# Patient Record
Sex: Male | Born: 1935 | Race: White | Hispanic: No | Smoking: Former smoker
Health system: Southern US, Community
[De-identification: ages and names within clinical notes are randomized; demographics above are authoritative.]

## PROBLEM LIST (undated history)

## (undated) DIAGNOSIS — I10 Essential (primary) hypertension: Secondary | ICD-10-CM

## (undated) DIAGNOSIS — IMO0001 Reserved for inherently not codable concepts without codable children: Secondary | ICD-10-CM

## (undated) DIAGNOSIS — J449 Chronic obstructive pulmonary disease, unspecified: Secondary | ICD-10-CM

## (undated) DIAGNOSIS — E119 Type 2 diabetes mellitus without complications: Secondary | ICD-10-CM

## (undated) DIAGNOSIS — H9190 Unspecified hearing loss, unspecified ear: Secondary | ICD-10-CM

## (undated) DIAGNOSIS — H01009 Unspecified blepharitis unspecified eye, unspecified eyelid: Secondary | ICD-10-CM

## (undated) DIAGNOSIS — E079 Disorder of thyroid, unspecified: Secondary | ICD-10-CM

## (undated) DIAGNOSIS — M109 Gout, unspecified: Secondary | ICD-10-CM

## (undated) DIAGNOSIS — E785 Hyperlipidemia, unspecified: Secondary | ICD-10-CM

## (undated) HISTORY — DX: Type 2 diabetes mellitus without complications: E11.9

## (undated) HISTORY — DX: Hyperlipidemia, unspecified: E78.5

## (undated) HISTORY — PX: ABDOMINAL AORTIC ANEURYSM REPAIR: SUR1152

## (undated) HISTORY — DX: Gout, unspecified: M10.9

## (undated) HISTORY — DX: Reserved for inherently not codable concepts without codable children: IMO0001

## (undated) HISTORY — DX: Unspecified hearing loss, unspecified ear: H91.90

## (undated) HISTORY — DX: Disorder of thyroid, unspecified: E07.9

## (undated) HISTORY — PX: CATARACT EXTRACTION: SUR2

## (undated) HISTORY — DX: Chronic obstructive pulmonary disease, unspecified: J44.9

## (undated) HISTORY — PX: JOINT REPLACEMENT: SHX530

## (undated) HISTORY — DX: Unspecified blepharitis unspecified eye, unspecified eyelid: H01.009

## (undated) HISTORY — DX: Essential (primary) hypertension: I10

---

## 1999-11-08 ENCOUNTER — Encounter: Payer: Self-pay | Admitting: Ophthalmology

## 1999-11-08 ENCOUNTER — Ambulatory Visit: Admission: RE | Admit: 1999-11-08 | Discharge: 1999-11-08 | Payer: Self-pay | Admitting: Ophthalmology

## 1999-11-17 ENCOUNTER — Ambulatory Visit (HOSPITAL_COMMUNITY): Admission: RE | Admit: 1999-11-17 | Discharge: 1999-11-18 | Payer: Self-pay | Admitting: Ophthalmology

## 2000-09-14 ENCOUNTER — Ambulatory Visit (HOSPITAL_COMMUNITY): Admission: AD | Admit: 2000-09-14 | Discharge: 2000-09-15 | Payer: Self-pay | Admitting: Ophthalmology

## 2000-09-14 ENCOUNTER — Encounter: Payer: Self-pay | Admitting: Ophthalmology

## 2002-05-26 ENCOUNTER — Encounter: Admission: RE | Admit: 2002-05-26 | Discharge: 2002-05-26 | Payer: Self-pay | Admitting: Vascular Surgery

## 2002-05-26 ENCOUNTER — Encounter: Payer: Self-pay | Admitting: Vascular Surgery

## 2002-05-29 ENCOUNTER — Encounter: Payer: Self-pay | Admitting: Vascular Surgery

## 2002-06-02 ENCOUNTER — Ambulatory Visit (HOSPITAL_COMMUNITY): Admission: RE | Admit: 2002-06-02 | Discharge: 2002-06-02 | Payer: Self-pay | Admitting: Vascular Surgery

## 2002-06-22 ENCOUNTER — Encounter: Payer: Self-pay | Admitting: Vascular Surgery

## 2002-06-22 ENCOUNTER — Inpatient Hospital Stay (HOSPITAL_COMMUNITY): Admission: RE | Admit: 2002-06-22 | Discharge: 2002-06-28 | Payer: Self-pay | Admitting: Vascular Surgery

## 2002-06-22 ENCOUNTER — Encounter (INDEPENDENT_AMBULATORY_CARE_PROVIDER_SITE_OTHER): Payer: Self-pay | Admitting: *Deleted

## 2002-06-23 ENCOUNTER — Encounter: Payer: Self-pay | Admitting: Vascular Surgery

## 2007-08-15 ENCOUNTER — Ambulatory Visit: Payer: Self-pay | Admitting: Cardiovascular Disease

## 2007-08-20 ENCOUNTER — Ambulatory Visit: Payer: Self-pay

## 2007-12-12 ENCOUNTER — Inpatient Hospital Stay (HOSPITAL_COMMUNITY): Admission: RE | Admit: 2007-12-12 | Discharge: 2007-12-16 | Payer: Self-pay | Admitting: Orthopedic Surgery

## 2008-04-15 DIAGNOSIS — E119 Type 2 diabetes mellitus without complications: Secondary | ICD-10-CM

## 2008-04-15 DIAGNOSIS — R079 Chest pain, unspecified: Secondary | ICD-10-CM

## 2008-04-15 DIAGNOSIS — Z9889 Other specified postprocedural states: Secondary | ICD-10-CM

## 2008-04-15 DIAGNOSIS — J449 Chronic obstructive pulmonary disease, unspecified: Secondary | ICD-10-CM

## 2008-04-15 DIAGNOSIS — I10 Essential (primary) hypertension: Secondary | ICD-10-CM

## 2008-04-15 DIAGNOSIS — M1990 Unspecified osteoarthritis, unspecified site: Secondary | ICD-10-CM | POA: Insufficient documentation

## 2008-04-16 ENCOUNTER — Ambulatory Visit: Payer: Self-pay | Admitting: Cardiovascular Disease

## 2008-05-10 ENCOUNTER — Inpatient Hospital Stay (HOSPITAL_COMMUNITY): Admission: RE | Admit: 2008-05-10 | Discharge: 2008-05-14 | Payer: Self-pay | Admitting: Orthopedic Surgery

## 2008-09-23 ENCOUNTER — Encounter (INDEPENDENT_AMBULATORY_CARE_PROVIDER_SITE_OTHER): Payer: Self-pay | Admitting: *Deleted

## 2009-12-02 ENCOUNTER — Encounter: Admission: RE | Admit: 2009-12-02 | Discharge: 2009-12-02 | Payer: Self-pay | Admitting: Nephrology

## 2010-05-10 LAB — BASIC METABOLIC PANEL
CO2: 27 mEq/L (ref 19–32)
CO2: 28 mEq/L (ref 19–32)
Calcium: 8.3 mg/dL — ABNORMAL LOW (ref 8.4–10.5)
Calcium: 8.3 mg/dL — ABNORMAL LOW (ref 8.4–10.5)
Calcium: 8.4 mg/dL (ref 8.4–10.5)
Calcium: 8.4 mg/dL (ref 8.4–10.5)
Chloride: 101 mEq/L (ref 96–112)
Chloride: 98 mEq/L (ref 96–112)
Creatinine, Ser: 0.95 mg/dL (ref 0.4–1.5)
Creatinine, Ser: 1.1 mg/dL (ref 0.4–1.5)
GFR calc Af Amer: >60 mL/min (ref >60)
GFR calc Af Amer: >60 mL/min (ref >60)
GFR calc Af Amer: >60 mL/min (ref >60)
GFR calc non Af Amer: >60 mL/min (ref >60)
GFR calc non Af Amer: >60 mL/min (ref >60)
Glucose, Bld: 119 mg/dL — ABNORMAL HIGH (ref 70–99)
Glucose, Bld: 131 mg/dL — ABNORMAL HIGH (ref 70–99)
Glucose, Bld: 151 mg/dL — ABNORMAL HIGH (ref 70–99)
Potassium: 3.8 mEq/L (ref 3.5–5.1)
Potassium: 3.9 mEq/L (ref 3.5–5.1)
Potassium: 4 mEq/L (ref 3.5–5.1)
Sodium: 133 mEq/L — ABNORMAL LOW (ref 135–145)
Sodium: 134 mEq/L — ABNORMAL LOW (ref 135–145)

## 2010-05-10 LAB — CBC
HCT: 29.4 % — ABNORMAL LOW (ref 39.0–52.0)
HCT: 29.4 % — ABNORMAL LOW (ref 39.0–52.0)
Hemoglobin: 10.2 g/dL — ABNORMAL LOW (ref 13.0–17.0)
Hemoglobin: 8.8 g/dL — ABNORMAL LOW (ref 13.0–17.0)
MCHC: 34.5 g/dL (ref 30.0–36.0)
MCHC: 34.8 g/dL (ref 30.0–36.0)
MCV: 92.9 fL (ref 78.0–100.0)
MCV: 93.9 fL (ref 78.0–100.0)
Platelets: 173 10*3/uL (ref 150–400)
RBC: 2.72 MIL/uL — ABNORMAL LOW (ref 4.22–5.81)
RBC: 3.19 MIL/uL — ABNORMAL LOW (ref 4.22–5.81)
RDW: 13 % (ref 11.5–15.5)
RDW: 13.1 % (ref 11.5–15.5)
WBC: 8.5 10*3/uL (ref 4.0–10.5)

## 2010-05-10 LAB — GLUCOSE, CAPILLARY
Glucose-Capillary: 108 mg/dL — ABNORMAL HIGH (ref 70–99)
Glucose-Capillary: 100 mg/dL — ABNORMAL HIGH (ref 70–99)
Glucose-Capillary: 103 mg/dL — ABNORMAL HIGH (ref 70–99)
Glucose-Capillary: 107 mg/dL — ABNORMAL HIGH (ref 70–99)
Glucose-Capillary: 108 mg/dL — ABNORMAL HIGH (ref 70–99)
Glucose-Capillary: 116 mg/dL — ABNORMAL HIGH (ref 70–99)
Glucose-Capillary: 124 mg/dL — ABNORMAL HIGH (ref 70–99)
Glucose-Capillary: 132 mg/dL — ABNORMAL HIGH (ref 70–99)
Glucose-Capillary: 147 mg/dL — ABNORMAL HIGH (ref 70–99)

## 2010-05-10 LAB — URINALYSIS, ROUTINE W REFLEX MICROSCOPIC
Hgb urine dipstick: NEGATIVE
Ketones, ur: NEGATIVE mg/dL
Nitrite: NEGATIVE
pH: 6 (ref 5.0–8.0)

## 2010-05-10 LAB — PROTIME-INR
INR: 1 (ref 0.00–1.49)
INR: 1.2 (ref 0.00–1.49)
Prothrombin Time: 22.7 seconds — ABNORMAL HIGH (ref 11.6–15.2)
Prothrombin Time: 20.6 seconds — ABNORMAL HIGH (ref 11.6–15.2)

## 2010-05-10 LAB — COMPREHENSIVE METABOLIC PANEL
AST: 14 U/L (ref 0–37)
Alkaline Phosphatase: 68 U/L (ref 39–117)
Chloride: 107 mEq/L (ref 96–112)
GFR calc non Af Amer: >60 mL/min (ref >60)
Glucose, Bld: 125 mg/dL — ABNORMAL HIGH (ref 70–99)
Total Protein: 6.5 g/dL (ref 6.0–8.3)

## 2010-05-10 LAB — TYPE AND SCREEN
ABO/RH(D): O POS
Antibody Screen: NEGATIVE

## 2010-06-13 NOTE — H&P (Signed)
NAMEEREN, RYSER NO.:  [id number]   MEDICAL RECORD NO.:  [id number]          PATIENT TYPE:  INP   LOCATION:  NA                           FACILITY:  [facility]   PHYSICIAN:  Ollen Gross, M.D.    DATE OF BIRTH:  Dec 03, 1924   DATE OF ADMISSION:  12/12/2007  DATE OF DISCHARGE:                              HISTORY & PHYSICAL   CHIEF COMPLAINT:  Bilateral knee pain, left greater than right.   HISTORY OF PRESENT ILLNESS:  Mr. Daywalt is an 77 year old male who has  been seen by Dr. Lequita Halt as a new patient for bilateral knee pain.  Left  has been more symptomatic than the right for quite some time now.  He  has had pain in the knees for many years.  He remains very active  though.  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  He has been seen by Dr. Trellis Paganini and  been given multiple injections in the past which have helped.  Currently  he has severe end stage arthritis of both knees; the left is a little  worse than the right.  He has severe bone-on-bone medial compartment  with patellofemoral compartments also involved.  He is at a point now  where injections are helping less and less.  He is having more symptoms  and wants to proceed with surgery.  He has been seen by Lindaann Pascal from  a medical standpoint and he has been referred over to Cardiology.  He  has been seen by Dr. Charlton Haws from a cardiac standpoint.  He has  undergone stress test and evaluation and felt to be stable for surgery.   ALLERGIES:  PENICILLIN causes welts.   CURRENT MEDICATIONS:  1. Furosemide 40 mg 3 times a day.  2. Simvastatin 40 mg daily.  3. Levothyroxine 50 mcg daily.  4. Tramadol 50 mg 1 every 4-6 hours as needed.  5. Amlodipine/benazepril 5/20 one tablet daily.  6. Spiriva inhaler once every morning.  7. ProAir inhaler as needed.   PAST MEDICAL HISTORY:  1. Blindness right eye.  2. Deafness right ear.  3. Hearing loss left  ear which he uses a hearing aid.  4. Questionable history of COPD with prior tobacco use.  5. Hypertension.  6. Has an abdominal aortic aneurysm.  7. Elevated cholesterol.  8. History of gout.  9. History of pilonidal cyst removal.  10.Diabetes mellitus.  11.Arthritis.   PAST SURGICAL HISTORY:  Abdominal aortic aneurysm repair in 2003.   FAMILY HISTORY:  Father with a history of heart attack.  Mother history  of stroke.   SOCIAL HISTORY:  Widow.  Retired.  Past smoker, quit back in 1983.  Two  children.  Lives alone.  One of his daughters will be helping him after  surgery.  He does have a ramp entering his one-story home.  He does have  healthcare power of attorney.   REVIEW OF SYSTEMS:  GENERAL:  No fevers, chills, night sweats.  NEURO:  No  seizures, syncope or paralysis.  He does have blindness in the right  eye and deafness in the right ear with hearing loss in the left ear  requiring hearing aid.  RESPIRATORY:  A little bit of shortness breath  noted on exertion.  No shortness of breath at rest.  No productive cough  or hemoptysis.  CARDIOVASCULAR:  No chest pain, angina or orthopnea.  GI: No nausea, vomiting, diarrhea or constipation.  GU: No dysuria,  hematuria or discharge.  MUSCULOSKELETAL:  Left knee greater than right  knee.   PHYSICAL EXAMINATION:  VITAL SIGNS: Pulse 76, respirations 14, blood  pressure 138/64.  GENERAL: The patient is an 77 year old white male well-nourished, well-  developed in no acute distress.  Hard of hearing.  He is accompanied by  his 2 daughters.  He is deaf in the right ear.  Also has a hearing aid  in the left ear.  HEENT: Normocephalic, atraumatic.  He is blind in the right eye.  Left  eye is regular rhythm.  EOMs intact on the left eye.  NECK:  Supple.  CHEST: Clear.  HEART: Regular rate and rhythm without murmur, S1 and S2 noted.  ABDOMEN: Soft, slightly round.  Bowel sounds present.  BREASTS/ GENITALIA:  Not done; not pertinent to  present illness.  EXTREMITIES:  Right knee shows no effusion.  Range of motion 5-120,  marked crepitus, no instability.  Left knee shows no effusion, moderate  varus malalignment.  Range of motion 5-115, tender more medial than  lateral.   IMPRESSION:  Osteoarthritis bilateral knees, left greater than right.   PLAN:  The patient admitted to [facility] to undergo a left  total knee replacement arthroplasty.  Surgery will be performed by Dr.  Ollen Gross.  He has been seen preoperatively by Dr. [name] and felt  that the patient would benefit from procedures.  He had a cardiac  Myoview which was read as a normal stress nuclear test and felt to be  low risk.      Alexzandrew L. Perkins, P.A.C.      Ollen Gross, M.D.  Electronically Signed    ALP/MEDQ  D:  12/02/2007  T:  12/02/2007  Job:  [id number]   cc:   Lindaann Pascal, PA-C   [name] [name], MD, [facility]  [address]  [address]  [place]  [place] [address]

## 2010-06-13 NOTE — Discharge Summary (Signed)
NAMEINMAN, FETTIG NO.:  [id number]   MEDICAL RECORD NO.:  [id number]          PATIENT TYPE:  INP   LOCATION:  1527                         FACILITY:  [facility]   PHYSICIAN:  Ollen Gross, M.D.    DATE OF BIRTH:  August 22, 1924   DATE OF ADMISSION:  12/12/2007  DATE OF DISCHARGE:  12/16/2007                               DISCHARGE SUMMARY   ADMITTING DIAGNOSES:  1. Osteoarthritis, left greater than right knee.  2. Blindness, right eye.  3. Deafness, right ear.  4. Hearing loss, left ear which she uses a hearing aid.  5. Questionable history of chronic obstructive pulmonary disease with      prior tobacco use.  6. Hypertension.  7. Abdominal aortic aneurysm.  8. Hypercholesterolemia.  9. History of gout.  10.History of pilonidal cyst removal.  11.Diabetes mellitus.  12.Arthritis.   DISCHARGE DIAGNOSES:  1. Osteoarthritis left knee status post left total knee replacement      arthroplasty.  2. Postoperative blood loss anemia, did not require transfusion.  3. Postoperative hyponatremia improved.  4. Postoperative atypical chest discomfort, ruled out cardiac.  5. Mild postoperative bibasilar atelectasis.   Remaining discharge diagnoses same as admitting diagnoses.   PROCEDURE:  December 12, 2007 left total knee, Surgeon Dr. Lequita Halt,  Assistant Avel Peace, PA-C.  Surgery performed under spinal anesthesia  with Duramorph.   CONSULTS:  None.   BRIEF HISTORY:  The patient is an 77 year old male with severe end-stage  arthritis of both knees, left greater than right, failing operative  management including injection, now presents for total knee  arthroplasty.   LABORATORY DATA:  Preop CBC showed a hemoglobin 15.4, hematocrit of  44.8, white cell count 6.3, platelets 225.  Chem panel:  Slightly  elevated glucose of 114, slightly elevated albumin 4.8, PT/INR 12.8 and  1 with a PTT of 29.  Preop UA negative.  Serial CBCs were followed,  hemoglobin did drop  down to 11 then 9.4, last known H and H was 8.9 and  25.6.  Serial ProTimes followed per Coumadin protocol, last known PT/INR  15.5 and 1.2.  Serial BMETs were followed, sodium did drop down to 133  then got as low as 129, was back up to 131, remaining electrolytes  remained within normal limits.  Two sets of cardiac enzymes taken:  First set:  CK 118, normal, normal CK-MB of 2.8, normal index of 2.4  with a normal troponin of 0.01; second set taken on December 15, 2007:  CK normal at 109, CK-MB normal at 2.6, relative index normal at 2.4,  normal troponin at 0.01; third set not done.   Chest x-ray:  Preop chest x-ray dated August 08, 2007 cardiomegaly with  dense, calcified, tortuous aorta, stable in configuration since previous  study, severe COPD with bibasilar scarring.  Follow-up chest x-ray:  Portable chest taken December 15, 2007, COPD with mild bibasilar  atelectasis.   EKG dated August 20, 2007:  Normal sinus rhythm, cannot rule out anterior  infarct, age undetermined, unconfirmed, was a faxed over copy.  Follow-  up  EKG December 15, 2007:  Sinus rhythm with occasional PVCs, otherwise  normal, unconfirmed.   HOSPITAL COURSE:  Patient admitted to [facility], taken to the  OR and underwent above-stated procedure without complication.  The  patient tolerated the procedure well.  Later transferred to recovery  room on orthopedic floor, started on PCA and p.o. analgesic pain control  following surgery, given 24 hours postoperative IV antibiotics.  Surgery  was performed on a Friday, therapy was started on Saturday, started  getting up out of bed by Sunday.  Sodium had dropped a little bit due to  fluid dilutional component as IVs were slowed down, encouraged pulmonary  toilet, started getting up and walking a few feet.  Dressing change,  incision looked good with the exception of some blisters on the lower  incision.  He was walking about 50 and 80 feet over the weekend.   Tegaderm  over the incision did have a little bit of bruising.  He was  seen on postoperative day 3 and earlier that morning he had had a little  bit of intermittent chest discomfort, denied any nausea and vomiting, no  radiation of the pain to the back, arm or neck, it was not constant.  By  the time he was seen on rounds the chest discomfort had resolved but he  had had it a couple of times.  Cardiac enzymes were taken at that point  which were negative.  We did another follow-up set which were negative.  EKG only showed some PVCs and his chest x-ray was clear otherwise, just  showing some atelectasis.  We encouraged the incentive spirometer.  Did  not require any nitro did not have any further chest discomfort after  morning rounds.  He was noted to have a little bit of abdominal  distention.  He was passing flatus but no bowel movements so we gave him  a Dulcolax suppository also MiraLAX, he got good relief with 2 movements  later that day and felt much better.  We rechecked his sodium and his  sodium was up at 131, also placed him on some iron supplement.  Therapy  was held that morning due to the atypical chest discomfort, by that  afternoon he resumed therapy and walked about 95 feet.  He was doing  well, seen on rounds on postoperative day 4 by Dr. Lequita Halt, no further  chest discomfort, tolerating medications.   DISCHARGE PLAN:  1. Patient discharged home on December 16, 2007.  2. Discharge diagnosis, please see above.  3. Discharge medications:  Lovenox Coumadin, Percocet, Robaxin.   FOLLOWUP:  Two weeks.   ACTIVITY:  Weightbearing as tolerated, left lower extremity total knee  protocol.  Home health PT/home health nursing.   DIET:  Heart-healthy cardiac diet.   DISPOSITION:  Home.   CONDITION ON DISCHARGE:  Improved.      Alexzandrew L. Perkins, P.A.C.      Ollen Gross, M.D.  Electronically Signed    ALP/MEDQ  D:  12/16/2007  T:  12/16/2007  Job:   [id number]   cc:   Ollen Gross, M.D.  Fax: [phone]   [name]. [name], MD, [facility]  [address]  [address]  [place]  [place] [address]   Lindaann Pascal, [place]

## 2010-06-13 NOTE — H&P (Signed)
[name], SPRAGG NO.:  [id number]   MEDICAL RECORD NO.:  [id number]          PATIENT TYPE:  INP   LOCATION:  1609                         FACILITY:  [facility]   PHYSICIAN:  Ollen Gross, M.D.    DATE OF BIRTH:  16-Feb-1924   DATE OF ADMISSION:  05/10/2008  DATE OF DISCHARGE:                              HISTORY & PHYSICAL   DATE OF OFFICE VISIT HISTORY AND PHYSICAL:  05/04/2008.   CHIEF COMPLAINT:  Right knee pain.   HISTORY OF PRESENT ILLNESS:  Patient is an 77 year old male who has seen  by Dr. Lequita Halt for ongoing right knee pain.  He is well known for having  previously undergone a left total knee in November 2009, but now  presents to have the right knee done which has continued to be  problematic and symptomatic.  Patient is subsequently admitted to the  hospital.   ALLERGIES:  PENICILLIN causes welts.  TRAMADOL causes breakouts.   CURRENT MEDICATIONS:  Amlodipine/benazepril, levothyroxine, simvastatin,  furosemide, Spiriva.   PAST MEDICAL HISTORY:  1. Blindness, right eye.  2. Deafness, right ear.  3. COPD.  4. Cataracts.  5. Hypertension.  6. History of abdominal aortic aneurysm.  7. Hypercholesterolemia.  8. History of gout.  9. History of pilonidal cyst.  10.Hypothyroidism.  11.Non-insulin-dependent diabetes mellitus.  12.Arthritis.   PAST SURGICAL HISTORY:  1. Abdominal aortic aneurysm repair in 2003.  2. Left total knee replacement November 2009.   SOCIAL HISTORY:  Widow.  Retired past smoker.  No alcohol.  Two  children.  Lives alone, but does have a healthcare power of attorney.   FAMILY HISTORY:  Father with a history of heart attack.  Mother with  history of stroke.   REVIEW OF SYSTEMS:  GENERAL:  No fevers, chills, or night sweats.  NEURO:  No seizures, syncope, or paralysis.  RESPIRATORY:  A little  shortness of breath on exertion, no shortness of breath at rest,  productive cough or hemoptysis.  CARDIOVASCULAR:  No chest  pain, angina  or orthopnea.  GI: No nausea, vomiting, diarrhea, or constipation.  GU:  No dysuria, hematuria, or discharge.  MUSCULOSKELETAL:  Right knee.   PHYSICAL EXAMINATION:  VITAL SIGNS:  Pulse 76.  Respirations 14.  Blood  pressure 118/64.  GENERAL:  An 77 year old white male well-nourished, well-developed, no  acute distress.  He is alert, oriented, and cooperative, accompanied by  his daughter.  HEENT: Normocephalic, atraumatic.  Blind right eye, left eye regular  rhythm.  Deaf right ear, hearing aid in the left ear.  NECK:  Supple.  Distant breath sounds, but present.  HEART:  Regular rate and rhythm with occasional skipped/ectopic beat.  S1, S2.  ABDOMEN:  Soft, nontender, bowel sounds present.  RECTAL/BREASTS/GENITALIA:  Not done, and not pertinent to present  illness.  EXTREMITIES:  Right knee.  Range of motion 5-120, slight laxity,  marked  crepitus, no instability.   IMPRESSION:  Osteoarthritis, right knee.   PLAN:  Patient admitted to [facility] to undergo a right total  knee replacement arthroplasty.  Surgery  will be performed by Dr. Ollen Gross.      [name], P.A.C.      Ollen Gross, M.D.  Electronically Signed    ALP/MEDQ  D:  05/11/2008  T:  05/11/2008  Job:  [id number]   cc:   Lindaann Pascal, P.A.C.   [name]. [name], MD, [facility]  [address]  [address]  [place]  [place] [address]

## 2010-06-13 NOTE — Op Note (Signed)
[name], [name] NO.:  [id number]   MEDICAL RECORD NO.:  [id number]          PATIENT TYPE:  INP   LOCATION:  0002                         FACILITY:  [facility]   PHYSICIAN:  Ollen Gross, M.D.    DATE OF BIRTH:  Jun 23, 1924   DATE OF PROCEDURE:  12/12/2007  DATE OF DISCHARGE:                               OPERATIVE REPORT   PREOPERATIVE DIAGNOSIS:  Osteoarthritis, left knee.   POSTOPERATIVE DIAGNOSIS:  Osteoarthritis, left knee.   PROCEDURE:  Left total knee arthroplasty.   SURGEON:  Ollen Gross, M.D.   ASSISTANT:  Avel Peace PA-C   ANESTHESIA:  Spinal with Duramorph.   ESTIMATED BLOOD LOSS:  Minimal.   DRAINS:  None.   TOURNIQUET TIME:  34 minutes at 300 mmHg.   COMPLICATIONS:  None.   CONDITION:  Stable to recovery room.   CLINICAL NOTE:  [name] is an 77 year old male with severe end-stage  arthritis of both knees, left more symptomatic than right.  He has  failed nonoperative management including injection and presents now for  total knee arthroplasty.   PROCEDURE IN DETAIL:  After successful administration of spinal  anesthetic, a tourniquet is placed high on the left thigh and left lower  extremity prepped and draped in the usual sterile fashion.  Extremity  was wrapped in Esmarch, knee flexed, tourniquet inflated to 300 mmHg.  Midline incision made with a 10 blade through subcutaneous tissue to the  level of the extensor mechanism.  A fresh blade was used to make a  medial parapatellar arthrotomy.  Soft tissue of the proximal medial  tibia subperiosteally elevated to the joint line with the knife and into  the semimembranosus bursa with a Cobb elevator.  Soft tissue laterally  is elevated with attention being paid to avoiding patellar tendon on  tibial tubercle.  Patella subluxed laterally, knee flexed 90 degrees,  ACL and PCL removed.  Drill was used to create a starting hole in the  distal femur and the canal was thoroughly  irrigated.  The 5 degree left  valgus alignment guide is placed and referencing off the posterior  condyles, rotations marked and the block pinned to remove 11 mm of the  distal femur.  Distal femoral resection is made with an oscillating saw.  Sizing block was placed, size 5 is most appropriate.  Rotations marked  at the epicondylar axis.  Size 5 cutting block is placed and then the  anterior, posterior and chamfer cuts are made.   The tibia subluxed forward and the menisci are removed.  Extramedullary  tibial alignment guide is placed referencing proximally at the medial  aspect of the tibial tubercle and distally along the second metatarsal  axis and tibial crest.  The block is pinned to remove 10 mm off the non  deficient lateral side.  Tibial resection is made with an oscillating  saw.  Size 5 is most appropriate tibial component and the proximal tibia  was prepared with the modular drill and keel punch for size 5.  Femoral  preparation is completed with the intercondylar  cut.   Size 5 mobile bearing tibial trial, size 5 posterior stabilized femoral  trial and 10 mm posterior stabilized rotating platform insert trial are  placed.  With the 10, full extension was achieved with excellent varus-  valgus, anterior-posterior balance throughout full range of motion.  The  patella was everted and thickness measured to be 28 mm.  Freehand  resection taken 16 mm, 41 template is placed, lug holes were drilled,  trial patella was placed and it tracks normally.  Osteophytes removed  off the posterior femur with the trial in place.  All trials were  removed and the cut bone surfaces prepared with pulsatile lavage.  Cement was mixed and once ready for implantation, a size 5 mobile  bearing tibial tray, size 5 posterior stabilized femur and 41 patella  are cemented into place.  The patella was held with a clamp.  Trial 10-  mm inserts placed, knee held in full extension and all extruded cement   removed.  When the cement was fully hardened then the permanent 10 mm  posterior stabilized rotating platform insert is placed in the tibial  tray.  Wound is copiously irrigated with saline solution and then  FloSeal injected on the posterior capsule, mediolateral gutters and  suprapatellar area.  Moist sponge is placed and the tourniquet released  for total time of 34 minutes.  Sponge is held 2 minutes and removed.  Minimal bleeding was encountered.  The bleeding that is encountered is  stopped with electrocautery.  Wounds again copiously irrigated with  saline solution and the arthrotomy closed with interrupted #1 PDS.  Flexion against gravity to 140 degrees.  Subcu was closed with  interrupted 2-0 Vicryl and subcuticular running 4-0 Monocryl.  The  incision is cleaned and dried and Steri-Strips and bulky sterile  dressing applied.  He is then placed into a knee immobilizer, awakened  and transferred to recovery in stable condition.      Ollen Gross, M.D.  Electronically Signed     FA/MEDQ  D:  12/12/2007  T:  12/12/2007  Job:  [id number]

## 2010-06-13 NOTE — Op Note (Signed)
NAMECOURTLAND, REAS NO.:  [id number]   MEDICAL RECORD NO.:  [id number]          PATIENT TYPE:  INP   LOCATION:  1609                         FACILITY:  [facility]   PHYSICIAN:  Ollen Gross, M.D.    DATE OF BIRTH:  14-Feb-1924   DATE OF PROCEDURE:  05/10/2008  DATE OF DISCHARGE:                               OPERATIVE REPORT   PREOPERATIVE DIAGNOSIS:  Osteoarthritis right knee.   POSTOPERATIVE DIAGNOSIS:  Osteoarthritis right knee.   PROCEDURE:  Right total knee arthroplasty.   SURGEON:  Ollen Gross, M.D.   ASSISTANT:  Avel Peace PA-C   ANESTHESIA:  Spinal.   ESTIMATED BLOOD LOSS:  Minimal.   DRAINS:  None.   TOURNIQUET TIME:  36 minutes at 300 mmHg.   COMPLICATIONS:  None.   CONDITION:  Stable to recovery.   CLINICAL NOTE:  [name] is an 77 year old male with end-stage arthritis  of the right knee with progressively worsening pain and dysfunction.  He  had a recent successful left total knee arthroplasty and presents now  for right total knee arthroplasty.   PROCEDURE IN DETAIL:  After successful administration of spinal  anesthetic, a tourniquet was placed on his right thigh and right lower  extremity is prepped and draped in the usual sterile fashion.  Extremity  is wrapped with Esmarch, knee flexed, tourniquet inflated to 300 mmHg.  Midline incision was made with 10 blade through subcutaneous tissue to  the level of the extensor mechanism.  A fresh blade is used make a  medial parapatellar arthrotomy.  Soft tissue over the proximal and  medial tibia subperiosteally elevated to the joint line with the knife  into the semimembranosus bursa with a Cobb elevator.  Soft tissue  laterally is elevated with attention being paid to avoiding the patellar  tendon on tibial tubercle.  Patella subluxed laterally, knee flexed 90  degrees, ACL and PCL removed.  Drill was used create a starting hole in  the distal femur and canal was thoroughly irrigated.   A 5 degrees right  valgus alignment guide is placed and referencing off the posterior  condyles, rotations marked and the block pinned to remove 11 mm of the  distal femur.  I took 11 because of preop flexion contracture.  Distal  femoral resection is made with an oscillating saw.  Sizing block is  placed, size 5 is most appropriate.  Rotations marked at the epicondylar  axis.  Size 5 cutting block is placed and the anterior, posterior and  chamfer cuts made.   Tibia subluxed forward and menisci removed.  Extramedullary tibial  alignment guide is placed referencing proximally at the medial aspect of  tibial tubercle and distally along the second metatarsal axis and tibial  crest.  The block is pinned to remove about 10 mm of the non deficient  lateral side.  Tibial resection is made with an oscillating saw.  The  size 6 is the most appropriate tibial component and the proximal tibia  is prepared with the modular drill and keel punch for the size 6.  Femoral preparation is completed with the intercondylar cut for the size  5.   Size 6 mobile bearing tibial trial, size 5 posterior stabilized femoral  trial and a 10 mL posterior stabilized rotating platform insert trial  are placed.  With the 10, full extension was achieved with excellent  varus-valgus and anterior-posterior balance.  The patella was then  everted and thickness measured to be 27 mm.  Freehand resection was  taken to 15 mm, 41 template is placed, lug holes were drilled, trial  patella was placed and it tracks normally.  Osteophytes removed off the  posterior femur with the trial in place.  All trials are removed and the  cut bone surfaces are prepared with pulsatile lavage.  Cement is made  and once ready for implantation, the size 6 tibia, size 5 posterior  stabilized femur and 41 patella are cemented into place.  The patella  was held with a clamp.  Trial 10-mm insert was placed, knee held in full  extension and all  extruded cement removed.  When the cement was fully  hardened then the permanent 10 mm posterior stabilized rotating platform  insert was placed into the tibial tray.  The wound was copiously  irrigated with saline solution and then the FloSeal injected on the  posterior capsule, medial and lateral gutters and suprapatellar area.  Moist sponge is placed and tourniquet released for total time of 36  minutes.  Minimal bleeding was encountered.  Bleeding that is  encountered stopped with electrocautery.  Wound was again irrigated and  the arthrotomy closed with interrupted #1 PDS.  Flexion against gravity  was to 140 degrees.  Subcu was closed with interrupted 2-0 Vicryl  subcuticular running 4-0 Monocryl.  The incision was cleaned and dried  and Steri-Strips and a bulky sterile were dressing were applied.  He was  is then placed into a knee immobilizer, awakened and transferred to  recovery in stable condition.      Ollen Gross, M.D.  Electronically Signed     FA/MEDQ  D:  05/10/2008  T:  05/10/2008  Job:  [id number]

## 2010-06-13 NOTE — Assessment & Plan Note (Signed)
[facility]                            CARDIOLOGY OFFICE NOTE   BAYLIN, GAMBLIN                           MRN:          [id number]  DATE:04/16/2008                            DOB:          07-12-1924    [name] seen today in followup.  The last time I saw him I believe was  in July and cleared him for left knee surgery with Dr. Lequita Halt.  He had  had some palpitations with an abnormal EKG and dyspnea.  He has had a  AAA repair in 2004.  His Myoview was normal with an EF of 56%.  This was  done on August 20, 2007.  In November, he had his left knee replaced by  Dr. Lequita Halt.  He had some atypical chest pain postop with some anemia  and atelectasis but did well.  He now needs to have the other knee done.  I told him, I think that since his last surgery went well he could have  the right surgery done.  There is no need for further testing.   His review of systems is otherwise negative.   He is allergic to PENICILLIN.   He is on;  1. Spiriva.  2. Furosemide 20 a day.  3. Amlodipine/benazepril 5/20.  4. Simvastatin 40.  5. Synthroid 50.   PHYSICAL EXAMINATION:  GENERAL:  Remarkable for an elderly jovial male  in no distress.  VITAL SIGNS:  His blood pressure is 120/70, pulse 88 and regular,  respiratory rate 14, afebrile.  HEENT:  Unremarkable.  NECK:  Carotids normal without bruit.  No lymphadenopathy, thyromegaly,  or JVP elevation.  LUNGS:  Clear with good diaphragmatic motion.  No wheezing.  HEART:  S1 and S2 with a transmitted murmur in systole.  PMI normal.  ABDOMEN:  Benign.  Bowel sounds positive.  No AAA, no tenderness, no  bruit.  No hepatosplenomegaly, hepatojugular reflux, or tenderness.  Status post AAA surgery.  EXTREMITIES:  Distal pulses are intact.  No edema.  NEUROLOGIC:  Nonfocal.  SKIN:  Warm and dry.  MUSCULOSKELETAL:  No muscular weakness.  Status post left knee  replacement.   Baseline EKG shows sinus rhythm with poor R  wave progression.   IMPRESSION:  1. The patient is cleared for orthopedic surgery by Dr. Lequita Halt.  He      did fine in November, no need for further testing.  2. Chronic obstructive pulmonary disease with chronic mild shortness      of breath.  Continue Spiriva, will need incentive spirometry at the      time of surgery.  3. Hypertension, currently well controlled.  Continue current dose of      diuretic and amlodipine and benazepril.  4. Hyperlipidemia.  Continue simvastatin.  Lipid and liver profile in      6 months.  5. Hypothyroidism.  Continue low-dose Synthroid replacement.  TSH per      primary care doctor.  6. Osteoarthritis.  Follow up with Dr. Lequita Halt to schedule surgery for      his right knee.  [name]. [name], MD, [facility]  Electronically Signed    PCN/MedQ  DD: 04/16/2008  DT: 04/17/2008  Job #: [id number]

## 2010-06-13 NOTE — Assessment & Plan Note (Signed)
[facility]                            CARDIOLOGY OFFICE NOTE   RALPH, BROUWER                           MRN:          [id number]  DATE:08/15/2007                            DOB:          07/11/1924    An 77 year old patient for preop clearance for right knee surgery with  Dr. Fannie Knee or his associates.  The patient has history of vascular disease  with previous AAA surgery by Dr. Arbie Cookey in 2004.  He has no documented  coronary artery disease, but has risk factors including previous  smoking, hypertension, hyperlipidemia, and borderline type 2 diabetes.   The patient is still as active as he can be.  He has quite a lot of pain  in both knees, right being worse than left.  This is the only thing that  limits him.  He does not have particular exertional dyspnea.  He has not  had chest pain.  He has never had a cardiac workup.   His activity is also limited a bit by being extremely hard of hearing  and blind in the left diet.   However, he does all of his activities of daily living.  He does a lot  of yard work and continues to drive.  He had no complications from his  major AAA surgery in 2004.   Apparently, he is seeing Dr. Lorin Picket Long already.  He was prescheduled  for carotid Dopplers this coming Thursday.  He has never had a TIA.  He  has not had anesthetic complications or bleeding diathesis.  I told the  patient given his multiple risk factors and the fact that he has known  vascular disease with a previous AAA and his advanced age that he should  have an adenosine Myoview study.  We will try to schedule this for the  same day as his carotids.  So long as this is a low-risk study, I think  he can be cleared for surgery.   From a cardiac perspective, I do not know that it makes a difference  whether he has general anesthesia or spinal.   REVIEW OF SYSTEMS:  Otherwise remarkable for some pain in both knees,  right greater than left.  He is blind  in the left eye, but has good  vision in the right eye.  He is extremely hard of hearing.   PAST MEDICAL HISTORY:  Remarkable for hypertension, hyperlipidemia,  borderline type 2 diabetes, blindness in the left eye, history of gout,  history of pilonidal cyst removal, and history of right cataract  surgery.   There is a question of COPD with previous smoking as the patient uses an  inhaler.   His past medical issues otherwise unremarkable.   He is allergic to PENICILLIN.   He is on Spiriva, Lasix 20 mg in the morning, amlodipine, benazepril  520, simvastatin 40 a day, and levothyroxine 50 a day.   FAMILY HISTORY:  Noncontributory.   The patient is retired.  He lives next door to his daughter.  He is  widowed.  He spends most of  his day watching soap operas and doing yard  work.  He quit smoking back in 1983 and does not drink.  He has severe  limitation in his activities due to knee pain.   PHYSICAL EXAMINATION:  GENERAL:  Remarkable for an elderly white male in  no distress.  VITAL SIGNS:  Blood pressure is 120/70, pulse 80 and regular,  respiratory rate 14, and afebrile.  HEENT:  Unremarkable.  He is extremely hard of hearing.  He is blind in  the left eye.  NECK:  There is a faint right carotid bruit.  Carotids are normal.  There is no JVP elevation.  No lymphadenopathy, thyromegaly, or  tenderness.  LUNGS:  Clear with good diaphragmatic motion.  No active wheezing.  S1  and S2 with normal heart sounds.  PMI normal.  ABDOMEN:  Benign with a previous AAA scar.  No residual bruit.  No  tenderness.  No epigastric pain.  No AAA.  No hepatosplenomegaly or  hepatojugular reflux.  EXTREMITIES:  Distal pulses are intact.  No edema.  NEURO:  Nonfocal.  SKIN:  Warm and dry.  MUSCULOSKELETAL:  No muscular weakness.   EKG shows sinus rhythm with somewhat poor R-wave progression and left  axis deviation.   IMPRESSION:  1. Preop clearance.  The patient have an adenosine Myoview.   We will      clear if this is low risk.  2. Abnormal EKG.  He has left axis deviation and poor R-wave      progression.  I do not suspect he has had a previous silent MI.      Since he does not have a significant murmur, I do not think he      needs an echo.  3. Question of chronic obstructive pulmonary disease.  I will leave it      up to his primary care doctor and orthopedic doctors to see as to      whether that he needs at baseline PFTs.  I think his lung capacity      is probably fine for surgery.  He will continue his Spiriva      inhaler.  4. Hypertension, currently well controlled.  Continue current dose of      diuretics, amlodipine, and benazepril.  5. Hyperlipidemia in the setting of previous abdominal aortic      aneurysm.  Continue simvastatin.  Lipid and liver profile in 6      months.  6. Hypothyroidism.  Continue Synthroid.  TSH and T4 should be checked      for any surgery.  7. Bilateral knee problems.  I suspect the patient would actually      benefit from stage procedures of right following left.  So long as      his carotids and a Myoview are low risk this coming Wednesday, he      will be cleared for surgery with Dr. Fannie Knee or one of his associates.     [name]. [name], MD, [facility]  Electronically Signed   PCN/MedQ  DD: 08/15/2007  DT: 08/15/2007  Job #: [id number]   cc:   Long Dr. Carlena Hurl A. Grant Ruts., M.D.

## 2010-06-16 NOTE — H&P (Signed)
NAME:  THERON, CUMBIE NO.:  [id number]   MEDICAL RECORD NO.:  [id number]                   PATIENT TYPE:  INP   LOCATION:  NA                                   FACILITY:  [facility]   PHYSICIAN:  Larina Earthly, M.D.                 DATE OF BIRTH:  19-Dec-1924   DATE OF ADMISSION:  06/22/2002  DATE OF DISCHARGE:                                HISTORY & PHYSICAL   His primary care is provided through [facility] in  [place]  CARDIOLOGIST: Dr. Peter Swaziland.   HISTORY OF PRESENT ILLNESS:  This is a 77 year old Caucasian man referred  from the [facility] for evaluation of an abdominal  aortic aneurysm.  He underwent an ultrasound for evaluation of abdominal  pain and this revealed an incidental finding of a 5.7 cm infrarenal  abdominal aortic aneurysm.  He has been asymptomatic from this.  He was  evaluated by Dr. Arbie Cookey in the [facility] office on 05/21/02.  Dr. Arbie Cookey recommended  a CT scan of the abdomen for further evaluation of the anatomy of his  aneurysm to be followed by repair of his aneurysm.  CT scan revealed his  aneurysm to be suprarenal.  Because of this a stent grafting was ruled out  and Dr. Arbie Cookey further recommended that he undergo aortogram.  The aortogram  was performed on 06/02/02 and confirmed that his aneurysm involved bilateral  renal arteries.  Other findings included normal distal aorta above the  bifurcations, large but non-aneurysmal bilateral iliac arteries with patent  SFA and runoff bilaterally.  He returned to the [facility]TS office on 06/18/02  accompanied by his daughters and had further discussion with Dr. Arbie Cookey  regarding treatment of his aneurysm.  Dr. Arbie Cookey did recommend proceeding  with elective repair of his abdominal aortic aneurysm.  The procedure risks  and benefits were discussed with Mr. Vidales and his daughters.  Mr. Matlack  agreed to proceed with surgery.  Prior to this  visit he had been cleared  from a cardiac stand-point by Dr. Swaziland.   PAST MEDICAL HISTORY:  1. Hypertension.  2. Dyslipidemia.  3. Diabetes type 2.  He is currently diet controlled, he checks his CBGs at     home and they range in the 100 to 110 range.  4. He is blind in his right eye, and is hard of hearing, right worse than     left.  5. History of gout, last flare up was last summer.   OTHER SURGICAL HISTORY:  Includes pilonidal cyst removal and right cataract  surgery.   ALLERGIES:  PENICILLIN (causes a rash).  He has taken amoxicillin in the  recent past without problem.   MEDICATIONS PRIOR TO ADMISSION:  1. Lotrel 5/20 daily.  2. Zocor 20 mg p.o. daily.  3. Hydrochlorothiazide 25 mg p.o.  daily.   SOCIAL HISTORY:  Mr. Vandiver is a widower, he has two grown children.  He is a  retired Producer, television/film/video.  He quit smoking tobacco 20 years ago.  He does  not use alcohol.  He currently lives alone, his daughters live close by.  He  lives in a single level dwelling.  He drives.  His daughters will be  available for assistance after hospital discharge.  His daughters are Ardeth Perfect and [employer].   FAMILY HISTORY:  Significant only for a father with cardiac disease who died  at 39 with myocardial infarction.   REVIEW OF SYSTEMS:  GENERAL: Mr. Carbonneau describes his health as good.  HEENT:  As above, he had decreased vision and decreased hearing.  He has no  difficulty to respond.  He does report some neck stiffness.  He wears  glasses.  PULMONARY: He has some dyspnea on exertion, no paroxysmal  nocturnal dyspnea.  No cough.  No chest pain.  GI: His weight has been  stable, his appetite is very good.  No recent abdominal pain.  He follows a  regular diet however he does avoid milk products.  GU: He has nocturia x3.  NEUROLOGIC: He has occasional dizziness, he has had no recent falls.  He  does complain of significant joint pains specifically in his knees.  He  ambulates  independently, does not use any assistance devices.  VASCULAR: He  has no complaints of claudication.  HEMATOLOGIC: There are no recent fevers,  night sweats.  He does report nose bleeds with aspirin use.  PSYCHE: No  recent symptoms of depression, anxiety or memory disturbance.   PHYSICAL EXAMINATION:  VITAL SIGNS: Blood pressure 132/80, heart rate 64,  respiratory rate 18.  He is 6 feet, 2 inches tall, he weighs 220 pounds.  GENERAL: He is a 77 year old Caucasian man in no acute distress.  HEENT: His bilateral pupils were round, his right pupil does not react.  His  oral mucosa is pink and moist.  He is edentulous.  NECK: Limited range of motion to the right, he has no carotid bruits,  thyromegaly or cervical lymphadenopathy.  LUNGS: His breathing is unlabored.  His breath sounds are clear bilaterally,  no wheezes or rhonchi are noted.  HEART: Regular rate and rhythm, no murmurs are appreciated.  ABDOMEN: Flat, he has positive bowel sounds.  His abdomen is soft and  nontender.  LOWER EXTREMITIES: Are without edema, varicosities or venous stasis changes.  He has 2+ pedal pulses.  NEUROLOGIC: He is alert and oriented, Cranial nerves II-XII are grossly  intact, his gait is steady.  He has full range of motion of his upper and  lower extremities.  His muscle strength is 4/5 upper and lower extremities  bilaterally.   LABORATORY DATA:  Are pending, they will include CBC, BMET, PT and INR.  Other diagnostics - arteriogram, please see the enclosed report as well as  the CT scan report.  ABIs  in CVTS office today, 06/18/02 are within normal  limits and greater than 1.0 bilaterally.  Carotid Doppler study also in CVTS  office revealed no significant internal carotid artery disease.   IMPRESSION:  77 year old man with a 5.7 cm suprarenal abdominal aortic  aneurysm.   PLAN:  Admission to [facility] in the care of Dr. Kristen Loader. Early for abdominal aortic aneurysm repair and bilateral  renal artery reimplantation.     Toribio Harbour, N.P.  Larina Earthly, M.D.    CTK/MEDQ  D:  06/18/2002  T:  06/19/2002  Job:  [id number]

## 2010-06-16 NOTE — Discharge Summary (Signed)
   NAMEDONOVEN, PETT                              ACCOUNT NO.:  [id number]   MEDICAL RECORD NO.:  [id number]                   PATIENT TYPE:  INP   LOCATION:  2017                                 FACILITY:  [facility]   PHYSICIAN:  Maple Mirza, P.A.              DATE OF BIRTH:  1924-04-02   DATE OF ADMISSION:  06/22/2002  DATE OF DISCHARGE:                                 DISCHARGE SUMMARY   DISCHARGE DIAGNOSIS:  end of dictation                                               Maple Mirza, P.A.    GM/MEDQ  D:  06/27/2002  T:  06/28/2002  Job:  [id number]   cc:   [facility]   Peter M. Swaziland, M.D.  [address]  [place], [place] [address]  Fax: [phone]

## 2010-06-16 NOTE — Op Note (Signed)
[name]                                  MRN:  [id number]           Operative Report                                  ATT:  Guadelupe Sabin, M.D.  Procedure:  09/14/00             DICT: PREOPERATIVE DIAGNOSeS: 1. Acute endophthalmitis left eye. 2. Pseudophakia left eye.  POSTOPERATIVE DIAGNOSES: 1. Acute endophthalmitis left eye. 2. Pseudophakia left eye.  OPERATION:  Posterior vitrectomy through pars plana using vitreous infusion suction cutter, intraocular antibiotic installation, inferior iridotomy, and aspiration of culture material from aqueous and vitreous.  SURGEON:  Guadelupe Sabin, M.D.  ASSISTANT:  Nurse.  ANESTHESIA:  General.  EXAM:  Ophthalmoscopy and ocular examination as previously described.  OPERATIVE PROCEDURE:  After the patient was prepped and draped a lid speculum was inserted in the left eye.  The eye was turned downward and a superior rectus traction suture placed.  A peritomy was performed adjacent to the limbus from the 9 to 4 oclock position superiorly.  Three sclerotomy sites were prepared at the 4, 10, and 2 oclock positions, 3.5 mm from the limbus, using the MVR blade.  The 6 mm vitreous infusion terminal was secured in place at the 4 oclock position with a 5-0 white mattress Dacron suture.  Due to the hazy vitreous, the tip could not be seen initially.  Using the MVR blade at the 12 oclock corneal limbus, incision was made into the anterior chamber.  A tuberculin syringe with a 25 gauge needle was used to aspirate approximately 1.1 cc of anterior material for culture purposes.  This was sent to the bacteriologic lab immediately for plating and smears.  Attention was then paid to the posterior segment, where the fiberoptic light pipe was then inserted into the vitreous cavity at the 2 oclock position.  At the 10 oclock position a similar TB syringe was inserted and approximately  0.1-0.2 cc of fluid aspirated for posterior vitreous culture.  The hand piece of the vitreous infusion suction cutter was then inserted with slow vitreous infusion suction cutting.  The vitreous was extremely hazy and there was marked white purulent material, particularly around the ciliary body area.  The vitreous cleared significantly, although visualization was still poor at the posterior retina.  This was in part due to the fibrin clot and fixed pupil.  It was therefore elected to reopen the 12 oclock needle aspiration site, and the hand piece of the vitreous infusion suction cutter was inserted, clearing the fibrin strands and hypopyon material inferiorly.  It was then elected to perform an inferior iridotomy with the same instrument.  This cleared the anterior view significantly, and the 12 oclock was once again sutured. Further vitreous infusion suction cuttings were continued in the vitreous cavity, extending to the mid or lower third of the vitreous.  Since there was still limited visualization of the retina itself, it was felt risky to proceed further under the limited visualization.  It was therefore elected to withdraw the instruments and close the sclerotomy sites, leaving the eye at this point slightly hypotonous.  The  previously prepared antibiotics consisting of vancomycin, Amikacin and steroid preparation dexamethasone 1/10 of a cc which were prepared for the pharmacy, were then injected after the sites had been closed and the intraocular pressure reestablished.  The conjunctiva was then closed with a 7-0 similar Vicryl suture.  Periocular antibiotics were then injected, consisting of vancomycin, Garamycin, and dexamethasone.  Maxitrol and atropine ointment were instilled in the conjunctival cul-de-sac.  At the beginning of the procedure the patient had received 1 g of Keflex intravenously.  Duration of procedure:  One to 1-1/2 hours.  The patient tolerated the procedure  well in general, left the operating room for the recovery room, and subsequently to the 23 hour observation unit. DD:  09/15/00 TD:  09/15/00 Job: [id number]

## 2010-06-16 NOTE — Discharge Summary (Signed)
[name], [name]                              ACCOUNT NO.:  [id number]   MEDICAL RECORD NO.:  [id number]                   PATIENT TYPE:  INP   LOCATION:  2017                                 FACILITY:  [facility]   PHYSICIAN:  Larina Earthly, M.D.                 DATE OF BIRTH:  07-Jan-1925   DATE OF ADMISSION:  06/22/2002  DATE OF DISCHARGE:  06/27/2002                                 DISCHARGE SUMMARY   DISCHARGE DIAGNOSIS:  A 5.7 cm infrarenal abdominal aortic aneurysm,  asymptomatic.   SECONDARY DIAGNOSES:  1. Hypertension.  2. Dyslipidemia.  3. Type 2 diabetes, diet controlled.  4. Blind o.d.  5. Hard of hearing.  6. History of gout.  7. Status post pilonidal cyst removal.  8. Right cataract surgery.   PROCEDURE:  Resection and grafting of abdominal aortic aneurysm.  Replacement of 20 mm Hemashield straight graft, reimplantation of the left  renal artery.  Dr. Tawanna Cooler Early was the surgeon.  The patient tolerated the  procedure well and was transferred to the intensive care unit in stable  condition.   DISCHARGE DISPOSITION:  [name] is ready for discharge on Jun 28, 2002,  postoperative day #6, after resection and grafting of abdominal aortic  aneurysm.  His postoperative laboratories were good.  Hemoglobin on  postoperative day #1, was 11, hematocrit 33, creatinine 1.3.  His NG tube  was removed on postoperative day #2.  His diet was advanced on postoperative  day #3.  He did have some nausea in the morning on postoperative day #4, but  later that day had a bowel movement.  He was kept n.p.o. until his nausea  passed.  His diet was restarted as a low sodium, low cholesterol, diabetic  diet on postoperative day #5, and [name] tolerated his diet very well.  He  is ambulating independently at the time of discharge.  His incision is  healing nicely without evidence of severe erythema or drainage.  He had full  bowel function.  He has not had any respiratory compromise in  the  postoperative period, and has not experienced any cardiac dysrhythmias.  He  goes home with the following medications:   DISCHARGE MEDICATIONS:  1. Ultram 50 mg one or two tabs q.6h. p.r.n. pain.  2. Lotrel 5/20 mg daily.  3. Zocor 20 mg daily.  4. Hydrochlorothiazide 25 mg daily.  5. Pepcid 25 mg b.i.d. x1 week.   DISCHARGE ACTIVITY:  He is to walk daily to keep up his strength.   DISCHARGE DIET:  A low sodium, low cholesterol diet.   WOUND CARE:  He may shower daily, and is to call Dr. Bosie Helper office if his  incision gets red or starts to drain fluid.   FOLLOWUP:  Office visit with Dr. Arbie Cookey in two weeks.  At that time, staples  will be removed.  Dr. Bosie Helper office will call and make that appointment.   HISTORY OF PRESENT ILLNESS:  [name] is a 77 year old gentleman referred  from the [facility] for evaluation of abdominal  aortic aneurysm.  He underwent an ultrasound for evaluation of abdominal  pain, and this revealed a 5.7 cm infrarenal abdominal aortic aneurysm.  He  was evaluated by Dr. Arbie Cookey on May 21, 2002.  Dr. Arbie Cookey recommended CT scan  of the abdomen for further evaluation.  The CT scan revealed that the  aneurysm was actually suprarenal.  Because of this, stent grafting was ruled  out, and Dr. Arbie Cookey further recommended an aortogram.  The aortogram was  performed on Jun 02, 2002, and confirmed that this aneurysm involved both  renal arteries.  Other findings, included a large, but non-aneurysmal  bilateral iliac arteries with patent superficial femoral arteries  bilaterally, and sufficient runoff below the knee.  [name] is now  preparing for surgery.  The risks and benefits have been described to Mr.  [name] and his daughters, and he agrees to proceed.  Prior to this visit, he  has been cleared from a cardiac standpoint by Dr. Swaziland.  Surgery is  planned for Jun 22, 2002.   HOSPITAL COURSE:  As described in discharge disposition.  [name] goes home  on Jun 28, 2002, postoperative day #6.  His postoperative complications were  minor, including some nausea on postoperative day #4, and some mild  confusion also on postoperative day #4.  Both the nausea and confusion have  completely resolved.     Maple Mirza, P.A.                    Larina Earthly, M.D.    GM/MEDQ  D:  06/27/2002  T:  06/27/2002  Job:  [id number]   cc:   Peter M. Swaziland, M.D.  [address], [address]  [place], [place] [address]  Fax: [phone]

## 2010-06-16 NOTE — Discharge Summary (Signed)
[place]. [facility]  Patient:    [name], [name]                           MRN: [id number] Adm. Date:  [id number] Disc. Date: [id number] Attending:  Ivor Messier                           Discharge Summary  This was a planned outpatient admission of this 77 year old white male admitted with a chronic and recurrent vitreous hemorrhage of the left eye secondary to a retinal tear.  HOSPITAL COURSE:  The patient was felt to be in satisfactory condition for the proposed surgery.  He, therefore, was taken to the operating room where a combined posterior vitrectomy with scleral buckling was performed and vitreous air exchange.  The patient tolerated the hour and one-half procedure under general anesthesia and was taken to the recovery room and subsequently to the 23-hour observation unit.  The patient was seen on the evening of surgery and the following morning. Examination revealed mild residual hemorrhage, diffuse type.  The patient state on awakening, however, his vision good in the operated eye and has been subsequently slightly hazy.  A good buckling indentation is created in the upper temporal quadrant and the retinal tear placed on the implant surface with good laser surrounding photocoagulation burns.  The patients vital signs were stable, and it was felt that the patient had achieved maximal hospital benefit and could be discharged home to be followed in the office.  The patient and his daughter were given a printed list of discharge instructions on the care and use of the operated eye.  DISCHARGE OCULAR MEDICATIONS:  Tobradex and Ckyclomydril ophthalmic solutions, 1 drop 5 minutes apart 4 times a day and Maxitrol and atropine ointment at bedtime.  CONDITION UPON DISCHARGE:  Improved.  DISCHARGE DIAGNOSES: 1. Chronic and recurrent vitreous hemorrhage, left eye. 2. Retinal tear, left eye. DD:  11/18/99 TD:  11/20/99 Job: [id number]

## 2010-06-16 NOTE — H&P (Signed)
[place]. [facility]  Patient:    [name], [name] Visit Number: [id number] MRN: [id number]          Service Type: DSU Location: [phone] Attending Physician:  Ivor Messier Adm. Date:  [id number] Disc. Date: [id number]   CC:         Robert L. Dione Booze, M.D.  Monica Becton, M.D.   History and Physical  This was an emergency outpatient admission of this 77 year old white male admitted with acute endophthalmitis of the left eye.  HISTORY OF PRESENT ILLNESS:  This patient has a complex past medical history involving his eyes.  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  He has done well until he developed a retinal tear in the left eye.  The patient had laser photocoagulation by Dr. Alan Mulder at the [facility].  The patient was seen in my office on September 26, 1999, for a dense vitreous hemorrhage in the left eye.  The patient was referred by his regular ophthalmologist, Dr. Ernesto Rutherford, at this time.  The patient was admitted as an emergency for a vitrectomy surgery of the left eye when the vitreous hemorrhage did not clear.  This was performed on November 17, 1999, with a combined scleral buckling posterior vitrectomy to elevate the retinal tear area to prevent further vitreous recurrent hemorrhage.  The patient did well following this surgery with retinal attachment and clearing of the vitreous with return of vision to 20/40.  Recently, however, the patient has developed cataract formation and the patient underwent cataract implant surgery by Dr. Dione Booze on September 09, 2000.  The patient was seen the following morning and felt to be doing well.  Since that time the patient has gradually developed blurring of vision and increasing discomfort or pain.  The patient was referred to my office by Dr. Lucious Groves office for retinal evaluation. 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   The patient was placed on frequent Pred Forte ophthalmic solution every hour and Cyclogyl ophthalmic solution to dilated the fixed pupil.  When the patient returned the following day on August 17, the patient stated that his vision had become much worse (in hand motion) and a hypopyon was noted.  It was felt that the patient had progressed to an endophthalmitis and arrangements were made for his emergency outpatient admission.  PAST MEDICAL HISTORY:  The patient is in stable health under the care of Dr. Vernon Prey, taking a cholesterol controlling tablet and a Lotrel 520 one tablet a day.  REVIEW OF SYSTEMS:  No cardiorespiratory complaints.  PHYSICAL EXAMINATION:  VITAL SIGNS:  As recorded on admission, temperature 96.9, blood pressure 134/81, pulse 66, respirations 16.  Height 6 feet 2 inches, weight 220 pounds.  GENERAL APPEARANCE:  The patient is an alert, blind, white male in acute ocular distress.  HEENT:  Visual acuity:  Hand motion, right eye, hand motion, left eye, with and without correction.  Applanation tonometry, left eye 9 mm.  External ocular and slitlamp examination right eye:  The cornea is clear, anterior chamber deep.  There is an old fixed pupil with a mature cataract present. There is exotropia present.  The left eye reveals injection with a hazy cornea, inferior hypopyon with 4+ superior cells.  There is a fibrin clot over the surface of the intraocular lens implant.  The pupil is hemidilated, slightly larger than the previous day.  Detailed  fundus examination reveals no details, and extensive vitreous haze.  CHEST:  Lungs clear to percussion and auscultation.  HEART:  Normal sinus rhythm, no cardiomegaly, no murmurs.  ABDOMEN:  Negative.  EXTREMITIES:  Negative.  ADMISSION DIAGNOSIS:  Acute endophthalmitis, left eye, following cataract implant surgery.  SURGICAL PLAN:  Posterior vitrectomy with installation of intraocular antibiotics and aspiration of  culture material.  The patient has been given oral discussion and printed information concerning the procedure and its very guarded prognosis.  The patient has elected to proceed with surgery as outlined. DD:  09/15/00 TD:  09/15/00 Job: [id number]

## 2010-06-16 NOTE — Discharge Summary (Signed)
[facility]  Patient:    [name], [name]                           MRN: [id number] Adm. Date:  [id number] Disc. Date: [id number] Attending:  Ivor Messier CC:         Doris Cheadle. Dione Booze, M.D.   Discharge Summary  This was an emergency outpatient admission of this 77 year old white male admitted with acute endophthalmitis of the left eye following cataract implant surgery.  HOSPITAL COURSE:  The patient was evaluated preoperatively and felt to be in satisfactory condition for the proposed surgery under general anesthesia.  He was therefore taken into the operating room where a complex posterior vitrectomy was performed using a vitreous infusion suction cutter associated with intraocular antibiotic installation, consisting of vancomycin, amikacin, and dexamethasone, and periocular injections of vancomycin, Garamycin, and dexamethasone.  The patient tolerated the hour to hour and a half procedure under general anesthesia well and was taken to the recovery room and subsequently to the 23 hour observation unit.  The patient was seen on the evening of surgery and the following morning.  The patient felt that the progressive pain which he had been experiencing had subsided.  The patient was receiving frequent every hour Pred Forte and _________ ophthalmic solutions. By the following morning it was felt that the patient had achieved maximum hospital benefit and he could be discharged to be followed in the office closely.  The patient was given his medications of Pred Forte and _________ to use every hour to every two hours at home along with his Cyclogyl ophthalmic solution which he had been using.  FOLLOW-UP:  Follow-up appointment in my office in 24 hours.  CONDITION ON DISCHARGE:  Improved.  PROGNOSIS:  Prognosis, however, still is extremely guarded.  DISCHARGE DIAGNOSIS:  Acute endophthalmitis following cataract surgery. DD:  09/15/00 TD:   09/15/00 Job: [id number]

## 2010-06-16 NOTE — Op Note (Signed)
[place]. [facility]  Patient:    [name], [name]                           MRN: [id number] Proc. Date: 11/17/99 Adm. Date:  [id number] Disc. Date: [id number] Attending:  Ivor Messier CC:         Doris Cheadle. Groat, M.D. (with x-ray, lab data, etc)   Operative Report  INDICATIONS:  This was a planned outpatient surgical admission of this 77 year old white male admitted with a chronic and recurrent vitreous hemorrhage of the left eye and retinal tear of the left eye.  PREOPERATIVE DIAGNOSIS:  Chronic and recurrent vitreous hemorrhage, retinal tear, horseshoe type, left eye.  POSTOPERATIVE DIAGNOSIS:  Chronic and recurrent vitreous hemorrhage, retinal tear, horseshoe type, left eye.  OPERATION:  Posterior vitrectomy through pars plana using vitreous infusion suction cutter, Endolaser photocoagulation to retinal tear, scleral buckling using solid silicone implants #279 and #240.  SURGEON:  Guadelupe Sabin, M.D.  ASSISTANT:  Nurse.  ANESTHESIA:  General.  OPHTHALMOSCOPY:  As previously described.  DESCRIPTION OF PROCEDURE:  After the patient was prepped and draped lid traction sutures were placed in the left upper and lower lids, where a lid speculum was inserted. A peritomy was performed adjacent to the limbus 360 degrees. The subconjunctival tissue was cleaned and the rectus muscles looped with 4-0 silk traction sutures. The sclera was inspected and felt to be in satisfactory condition for the proposed surgery. Localization was then carried out using the retinal Cryoprobe. The retinal tear appeared to be slightly posterior to the equator at the 2:30 position. Lamellar scleral dissection was then carried out from the 1:30 to the 3:00 position. The bed measuring 11 mm in width. Light diathermy applications were applied to the inner scleral lamella. A total of three 4-0 green Mersilene sutures were used to loosely close the flaps with 3-0 plain  catgut reinforcements over a trimmed #279 solid silicone implant and #240 solid silicone encircling band was placed about the globe, tied with two sutures of 4-0 Mersilene at the 8:00 position. Anchoring sutures were placed at the 7:30, 10 and 4:00 position to hold the encircling band in place. The vitreous instruments were then aligned with ocular microscope. Three sclerotomy sites were prepared 3.5 mm from the limbus using the MVR blade and 20 gauge needle. The 4 mm vitreous infusion terminal was secured in place at the 4:00 position with the 5-0 white mattress Dacron suture. The tip could be seen projecting into the vitreous cavity. The fiberoptic light pipe was then inserted at the 2:00 position and the hand piece vitreous infusion suction cutter at the 10:00 position. Slow vitreous infusion suction cutting were begun from an anterior to a posterior direction toward the retina. Recent and old vitreous hemorrhage was present. As the vitreous cleared the underlying retinal horseshoe tear and avulsed retinal blood vessel could be seen. The vitreous infusion suction cutter was then used to trim the flap of the horseshoe retinal tear and to trim the flap of the horseshoe tear. The cutter was also used to sever the retinal blood vessel, which was looped up into the vitreous. There was a small amount of bleeding from the blood vessel, which stopped immediately. The Endolaser curved photocoagulation probe was then inserted with 96 applications surrounding the retinal tear, which had become more visible during the traction on the flap. Good laser reaction was achieved. A vitreous air exchange was then  carried out to tamponade the retina to allow pressure regulation in the eye. The scleral flaps of the buckle were then pulled up securely and the tension of the encircling band adjusted. A good buckling indentation was created and the tear and blood vessel appeared to be in satisfactory position on  the implant surface. It was, therefore, elected to close.  The instruments were withdrawn from the vitreous and the sclerotomy sites closed with 7-0 interrupted Vicryl sutures. The conjunctiva was then brought forward in the four quadrants and tied as a separate layer with four 6-0 Chromic catgut sutures. A running 6-0 Chromic catgut suture was then used to close the conjunctiva. Depo, Garamycin and Decadron were injected in the sub-Tenons space inferiorly and Neosporin ophthalmic solution irrigated in the subtenon space. Maxitrol and Atropine ointment were instilled in the conjunctival cul de sac and a light patch and protector shield applied.  DURATION OF PROCEDURE:  1.5 to 2 hours.  The patient tolerated the procedure well in general and left the operating room for the recovery room in good condition. DD:  11/17/99 TD:  11/17/99 Job: [id number]

## 2010-06-16 NOTE — Op Note (Signed)
[name], [name]                              ACCOUNT NO.:  [id number]   MEDICAL RECORD NO.:  [id number]                   PATIENT TYPE:  OIB   LOCATION:  NA                                   FACILITY:  [facility]   PHYSICIAN:  Larina Earthly, M.D.                 DATE OF BIRTH:  April 29, 1924   DATE OF PROCEDURE:  06/02/2002  DATE OF DISCHARGE:                                 OPERATIVE REPORT   PREOPERATIVE DIAGNOSIS:  Suprarenal abdominal aortic aneurysm.   POSTOPERATIVE DIAGNOSIS:  Suprarenal abdominal aortic aneurysm.   OPERATION PERFORMED:  Aortogram with bilateral lower extremity runoff.   SURGEON:  Larina Earthly, M.D.   ANESTHESIA:  1% lidocaine local.   COMPLICATIONS:  None.   DISPOSITION:  To holding area stable.   DESCRIPTION OF PROCEDURE:  The patient was taken to the peripheral vascular  cath lab and placed in supine position where the area of both groins was  prepped and draped in the usual sterile fashion.  Using local anesthesia and  a single wall puncture, the right common femoral artery was entered and a  guidewire was passed up to the level of the suprarenal aorta.  A 5 French  sheath was passed over the guidewire and the pigtail catheter was positioned  at the level of the suprarenal aorta.  AP, right and left oblique and  lateral projections were undertaken.  This revealed a bilobed abdominal  aortic aneurysm.  There was calcification indicating the true wall of the  aneurysm with a great deal of mural thrombus present as well.  The superior  mesenteric artery was widely patent and there were patent right and left  renal arteries bilaterally.  There appeared to be early branching of the  renal arteries bilaterally as well.  The aneurysm began above the renal  artery take offs bilaterally and it was possible that nearly common orifices  for the renal arteries as well since exact visualization of the orifice was  difficult due to the aneurysm overlapping this.  The  aneurysm did appear to  begin below the level of the superior mesenteric artery take off.  Next the  pigtail catheter was put down the infrarenal sac of the aorta and runoff  views were obtained.  This showed diffuse arteriomegaly with no evidence of  aneurysm in the iliac arteries bilaterally.  The patient had a normal  appearing aorta just above the bifurcation.  There did appear to be a  moderate amount of calcification in this area.  The internal iliac arteries  were widely patent bilaterally.  There was no evidence of flow in the  inferior mesenteric artery.  The superficial femoral arteries were widely  patent bilaterally.  There was no evidence of popliteal artery aneurysms.  The popliteal artery itself was widely patent bilaterally.  There was some  distal washout of contrast but  it appeared that the three-vessel runoff was  patent bilaterally as well.  The patient tolerated the procedure without  immediate complications and was transferred to the holding area in stable  condition.   FINDINGS:  1. Suprarenal abdominal aortic aneurysm involving both renal arteries     beginning the level of the superior     mesenteric artery.  2. Normal distal aorta just above the bifurcation with normal caliber and     moderate calcification and large but nonaneurysmal iliac arteries with     patent superficial femoral and runoff bilaterally.                                               Larina Earthly, M.D.    TFE/MEDQ  D:  06/02/2002  T:  06/02/2002  Job:  [id number]

## 2010-06-16 NOTE — H&P (Signed)
[place]. [facility]  Patient:    [name], [name]                           MRN: [id number] Adm. Date:  [id number] Disc. Date: [id number] Attending:  Ivor Messier                         History and Physical  HISTORY OF PRESENT ILLNESS:  This was a planned outpatient surgical admission of this 77 year old white male admitted with a chronic and recurrent vitreous hemorrhage of the left eye and retinal tear of the left eye.  This patient experienced a retinal tear in the left eye approximately two years ago and had laser photocoagulation performed by Dr. Alan Mulder at the [facility]. The patient did well until recently when he developed a dense vitreous hemorrhage in August 2001. Examination at that time revealed a visual acuity of finger counting in the left eye to be operated upon. The patient is blind in the right eye from childhood with hand motion vision. Due to the marked decrease in vision and limitation of activity, the patient was carefully observed, but the hemorrhage did not completely clear and it was noted that the patient had an avulsed retinal blood vessel, which was pulling in the area of the horseshoe tear, at the 2 oclock position. It was therefore elected to perform a posterior vitrectomy to clear the dense vitreous hemorrhage and a scleral buckling to indent the eye to release further traction on the blood vessel causing the bleeding. The patient signed an informed consent and arrangements were made for his outpatient admission, at this time.  PAST MEDICAL HISTORY:  The patient is in stable general health under the care of Dr. Rudi Heap in [place]_. He is felt to be in satisfactory condition for the proposed surgery.  REVIEW OF SYSTEMS:  No current cardiorespiratory complaints.  PHYSICAL EXAMINATION:  GENERAL:  The patient is an alert, well-nourished, well-developed white male with limited  vision.  HEENT:  Eyes; visual acuity as noted above. There is a slight right exotropia, a dense old hypermature cataract is present in the right. No fundus view is obtained. Left eye: The cornea is clear, anterior chamber deep and clear, lens clear. Fundus examination reveals a dense vitreous hemorrhage. The retinal horseshoe tear can be seen at the 2 oclock position surrounded by laser photocoagulation. An avulsed retinal blood vessel is present, looped up into the vitreous with traction and a small operculum from the flap of the retinal tear. The optic nerve, blood vessels and macula appear normal. There is no retinal detachment present.  CHEST AND LUNGS:  Clear to percussion and auscultation.  HEART:  Normal sinus rhythm. No cardiomegaly. No murmurs.  ABDOMEN:  Negative.  EXTREMITIES:  Negative.  ADMISSION DIAGNOSES:  Chronic and recurrent vitreous hemorrhage, left eye; retinal tear, left eye; blindness, right eye.  SURGICAL PLAN:  Posterior vitrectomy through pars plana with associated scleral buckling, endolaser photocoagulation. DD:  11/17/99 TD:  11/17/99 Job: [id number]

## 2010-06-16 NOTE — Discharge Summary (Signed)
[name], [name] NO.:  [id number]   MEDICAL RECORD NO.:  [id number]          PATIENT TYPE:  INP   LOCATION:  1609                         FACILITY:  [facility]   PHYSICIAN:  Ollen Gross, M.D.    DATE OF BIRTH:  1925/01/01   DATE OF ADMISSION:  05/10/2008  DATE OF DISCHARGE:  05/14/2008                               DISCHARGE SUMMARY   ADMITTING DIAGNOSES:  1. Osteoarthritis right knee.  2. Blindness right eye.  3. Deafness right ear.  4. Chronic obstructive pulmonary disease.  5. Cataracts.  6. Hypertension.  7. History of abdominal aortic aneurysm.  8. Hypercholesterolemia.  9. History of gout.  10.History of pilonidal cyst.  11.Hypothyroidism.  12.Non-insulin-dependent diabetes mellitus.  13.Arthritis.   DISCHARGE DIAGNOSES:  1. Osteoarthritis right knee, status post right total knee replacement      arthroplasty.  2. Postoperative acute blood loss anemia.  3. Status post transfusion without sequelae.  4. Orthostatic hypotension secondary to #2.  5. Mild hyponatremia, improving.  6. Postoperative mild hypokalemia, improving.  7. Blindness right eye.  8. Deafness right ear.  9. Chronic obstructive pulmonary disease.  10.Cataracts.  11.Hypertension.  12.History of abdominal aortic aneurysm.  13.Hypercholesterolemia.  14.History of gout.  15.History of pilonidal cyst.  16.Hypothyroidism.  17.Non-insulin-dependent diabetes mellitus.  18.Arthritis.   PROCEDURE:  On May 10, 2008, right total knee.  Surgeon, Dr. Lequita Halt.  Assistant, Avel Peace, PA-C.  Surgery under spinal anesthesia with  Duramorph added.  Tourniquet time 36 minutes.   CONSULTS:  None.   BRIEF HISTORY:  [name] is an 77 year old male with end-stage arthritis  of the right knee with progressive worsening pain and dysfunction,  recent successful left total knee, now presents for right total knee.   LABORATORY DATA:  Preop CBC showed a hemoglobin 13.7, hematocrit of  39.5,  white cell count 6.5, platelets 173.  Postop hemoglobin 10.3,  drifted down to 8.8.  Given 2 units of blood.  Post transfusion  hemoglobin back up to 10.2 with hematocrit of 29.4.  PT/PTT preop 12.8  and 27 respectively.  INR 1.0.  Serial protimes followed per Coumadin  protocol.  Last PT/INR 22.7 and 1.9.  Chem panel on admission all within  normal limits.  Serial BMETs were followed and sodium dropped from 139-  132, back up to 133.  Potassium dropped from 3.6-3.4, back up to 4.0.  Preop UA negative.  Blood group type O+.  EKG preop on December 15, 2007, showed sinus rhythm with occasional premature ventricular  complexes, otherwise normal, confirmed Dr. Daphene Jaeger.  Follow-up EKG on  May 13, 2008, sinus rhythm, occasional premature ventricular  complexes, no significant change since last of December 15, 2007,  confirmed Dr. Viann Fish.  Stress test on August 20, 2007, normal  stress test.   HOSPITAL COURSE:  The patient admitted to [facility], taken to  the OR and underwent the above-stated procedure without complication.  The patient tolerated the procedure well and later transferred to the  recovery room and orthopedic floor, started on PCA and  p.o. analgesics  for pain control following surgery.  He had a little bit of pain postop.  Encouraged meds.  Sodium was down a little bit, felt to be dilutional  component.  Started back on his home meds and his Spiriva and ProAir for  his COPD.  He continue the oxygen.  He was a diet controlled diabetic  and watched his CBGs.  He starting getting up out of bed on day 1.  Actually did very well, walked about 50 feet.  By day 2, he was  progressing with therapy.  Dressing changed, incision looked good.  Hemoglobin was down a little bit.  He was asymptomatic with this.  Sodium reached its lowest point at 132, we rechecked that.  Hemoglobin  was 9.6.  He developed a little mild hypotension when getting up, which  he responded to  fluids, that was in the evening.  We rechecked his  vitals and hemoglobin the next morning, and he was found to be still a  little orthostatic with a hemoglobin of 8.8.  due to the drop in his  pressure, we checked a chest x-ray and EKG.  EKG:  There was no change.  Chest x-ray was reported as stable with the exception of mild bibasilar  atelectasis, but no edema or pneumonia.  Due to the orthostatic  hypotension, we gave the patient 1 unit of blood and he responded well  to this.  He had a little bit drop in his potassium down to 3.4, so we  put him on potassium supplements.  His sodium levels were improving.  He  tolerated the blood well and was doing much better by the following day.  The hypotension had resolved.  His sodium had improved.  His potassium  had improved.  His INR was near therapeutic at 1.9.  He was doing well  and he was ready to go home.   DISCHARGE/PLAN:  1. Patient discharged home on May 14, 2008.  2. Discharge diagnoses:  Please see above.  3. Discharge meds:  Percocet, Robaxin, Nu-Iron, Coumadin.  4. Diet:  Heart-healthy diet.  5. Activity:  Total knee protocol.  Home health PT, home health      nursing.  6. Follow-up in 2 weeks.   DISPOSITION:  Home.   CONDITION ON DISCHARGE:  Improved.      Alexzandrew L. Perkins, P.A.C.      Ollen Gross, M.D.  Electronically Signed    ALP/MEDQ  D:  05/28/2008  T:  05/28/2008  Job:  [id number]   cc:   [name]. [name], MD, [facility]  [address]  [address]  [place]  [place] [address]   Lorin Picket PA-C [place]   Ollen Gross, M.D.  Fax: [phone]

## 2010-06-16 NOTE — Op Note (Signed)
[name], [name]                              ACCOUNT NO.:  [id number]   MEDICAL RECORD NO.:  [id number]                   PATIENT TYPE:  INP   LOCATION:  2899                                 FACILITY:  [facility]   PHYSICIAN:  Larina Earthly, M.D.                 DATE OF BIRTH:  1924-03-16   DATE OF PROCEDURE:  06/22/2002  DATE OF DISCHARGE:                                 OPERATIVE REPORT   PREOPERATIVE DIAGNOSIS:  Suprarenal abdominal aortic aneurysm.   POSTOPERATIVE DIAGNOSIS:  Suprarenal abdominal aortic aneurysm.   PROCEDURES:  1. Resection and grafting of abdominal aortic aneurysm and reimplantation of     left renal artery into the graft.  Repair with a 20 mm straight graft.   SURGEON:  Larina Earthly, M.D.   ASSISTANT:  Coral Ceo, P.A.   ANESTHESIA:  General endotracheal.   COMPLICATIONS:  None.   DISPOSITION:  To recovery room stable.   DESCRIPTION OF PROCEDURE:  The patient was taken to the operating room and  placed in the supine position, where the area of the abdomen and both groins  were prepped and draped in the usual sterile fashion.  An incision was made  from the level of the xiphoid to below the umbilicus and carried down  through the midline fascia with electrocautery.  The peritoneum was entered  and the abdomen was explored.  The Omnitract retractor was used for  exposure.  The transverse colon and omentum were reflected superiorly and  the small bowel was reflected to the right.  The duodenum was mobilized off  the aneurysm.  The inferior mesenteric vein was ligated and divided.  The  renal vein was exposed, and the gonadal and adrenal veins were ligated as  they came off the left renal vein for mobilization.  The aorta was exposed  and controlled above the level of the renal arteries and below the superior  mesenteric artery.  Both renal arteries were isolated and had early  branching to the lower pole.  Both branches of both renal arteries were  controlled with vessel loops.  The dissection was carried down to the  bifurcation.  The common femoral arteries were controlled bilaterally.  The  inferior mesenteric artery was small and arose just at the aortic  bifurcation itself.  This was ligated at the takeoff from the artery, had  good back bleeding, and was ligated.  Next the patient was given 25 g of  mannitol and 9000 units of intravenous heparin.  The patient had been  maintained on renal-dose dopamine at 3 mcg/kg per minute.  After adequate  circulation time, the aorta was occluded with a DeBakey aortic clamp above  the renal arteries and below the superior mesenteric artery.  The renal  arteries were occluded bilaterally and the common iliac arteries were  occluded bilaterally.  The aneurysm sac was opened  and lumbar back bleeding  was controlled with 2-0 silk figure-of-eight sutures.  The aorta was  transected distally above the aortic bifurcation.  Lumbar back bleeders were  controlled as above.  The right renal artery came off slightly lower than  the left.  The left renal artery arose from an area of aneurysm and  therefore using a cuff of aorta, the renal artery was resected from the  aneurysm wall.  The patient had calcification at the right renal artery.  There was no evidence of aneurysmal degeneration of the aortic wall itself  at this position.  For this reason a decision was made to slightly bevel the  graft and leave the right renal artery intact with the native aorta.  A 20  cm Hemashield graft was slightly beveled using a felt strip for  reinforcement and was sewn end-to-end to the aorta with a running 3-0  Prolene suture.  The anastomosis was tested and found to be adequate.  Next  the small ellipse of graft was removed to the appropriate position and the  left renal artery was sewn end-to-side to the aortic graft with a running 5-  0 Prolene suture.  After the usual flushing maneuvers, the flow was restored  to  the renal arteries and a clamp was placed on the aortic graft more  distally.  Total renal ischemia time was approximately 55 minutes.  Next the  graft was cut to appropriate length and again using a felt strip for  reinforcement was sewn end-to-end to the distal aorta just above the  bifurcation with a running 3-0 Prolene suture.  Prior to completion of the  anastomosis, the usual flushing maneuvers were undertaken.  The anastomosis  was completed and flow was restored to both legs.  The patient was given 100  mg of protamine to reverse the heparin.  The hemostasis was obtained with  electrocautery.  The aneurysm wall was closed over the graft with a running  2-0 Vicryl suture.  Next the retroperitoneum was closed with a running 2-0  Vicryl suture.  The small bowel was run in its entirety and found to be  without injury.  The patient did have an incidental finding of a Meckel's  diverticulum.  The transverse colon and omentum were placed over the small  bowel and the midline fascia was closed with a #1 PDS suture beginning  proximally and distally and tying in the middle.  Skin was closed with skin  clips.  Sterile dressing was applied.  The patient was taken to the recovery  room in stable condition.                                                Larina Earthly, M.D.    TFE/MEDQ  D:  06/22/2002  T:  06/22/2002  Job:  [id number]   cc:   Peter M. [place], M.D.  [address], [address]  [place], [place] [address]  Fax: [phone] [address] [place], [place] [address] [place]. Pract.

## 2010-10-31 LAB — CK TOTAL AND CKMB (NOT AT ARMC)
CK, MB: 2.8
Relative Index: 2.4
Relative Index: 2.4
Total CK: 109

## 2010-10-31 LAB — CBC
Hemoglobin: 11 — ABNORMAL LOW
MCHC: 35.2
MCV: 92.7
Platelets: 150
Platelets: 161
RBC: 2.76 — ABNORMAL LOW
RDW: 13.2
WBC: 7.3

## 2010-10-31 LAB — GLUCOSE, CAPILLARY
Glucose-Capillary: 127 — ABNORMAL HIGH
Glucose-Capillary: 134 — ABNORMAL HIGH
Glucose-Capillary: 137 — ABNORMAL HIGH
Glucose-Capillary: 142 — ABNORMAL HIGH
Glucose-Capillary: 146 — ABNORMAL HIGH
Glucose-Capillary: 157 — ABNORMAL HIGH
Glucose-Capillary: 162 — ABNORMAL HIGH

## 2010-10-31 LAB — BASIC METABOLIC PANEL
BUN: 13
CO2: 27
Calcium: 8.7
Calcium: 8.7
Chloride: 98
Creatinine, Ser: 0.9
GFR calc Af Amer: >60
GFR calc non Af Amer: >60
GFR calc non Af Amer: >60
Glucose, Bld: 118 — ABNORMAL HIGH
Glucose, Bld: 140 — ABNORMAL HIGH
Sodium: 131 — ABNORMAL LOW
Sodium: 133 — ABNORMAL LOW

## 2010-10-31 LAB — APTT: aPTT: 29

## 2010-10-31 LAB — URINALYSIS, ROUTINE W REFLEX MICROSCOPIC
Bilirubin Urine: NEGATIVE
Ketones, ur: NEGATIVE
Nitrite: NEGATIVE
Protein, ur: NEGATIVE
Urobilinogen, UA: 0.2

## 2010-10-31 LAB — PROTIME-INR
INR: 1
INR: 1.1
INR: 1.2
Prothrombin Time: 12.8
Prothrombin Time: 15.1
Prothrombin Time: 16.2 — ABNORMAL HIGH

## 2010-10-31 LAB — TYPE AND SCREEN

## 2010-10-31 LAB — TROPONIN I: Troponin I: 0.01

## 2012-05-19 DIAGNOSIS — Z0289 Encounter for other administrative examinations: Secondary | ICD-10-CM

## 2012-11-05 ENCOUNTER — Ambulatory Visit (INDEPENDENT_AMBULATORY_CARE_PROVIDER_SITE_OTHER): Payer: Medicare Other

## 2012-11-05 ENCOUNTER — Ambulatory Visit (INDEPENDENT_AMBULATORY_CARE_PROVIDER_SITE_OTHER): Payer: Medicare Other | Admitting: General Practice

## 2012-11-05 ENCOUNTER — Encounter (INDEPENDENT_AMBULATORY_CARE_PROVIDER_SITE_OTHER): Payer: Self-pay

## 2012-11-05 VITALS — BP 136/80 | HR 95 | Temp 96.7°F | Wt 228.0 lb

## 2012-11-05 DIAGNOSIS — R799 Abnormal finding of blood chemistry, unspecified: Secondary | ICD-10-CM

## 2012-11-05 DIAGNOSIS — R7989 Other specified abnormal findings of blood chemistry: Secondary | ICD-10-CM

## 2012-11-05 DIAGNOSIS — E119 Type 2 diabetes mellitus without complications: Secondary | ICD-10-CM

## 2012-11-05 DIAGNOSIS — R109 Unspecified abdominal pain: Secondary | ICD-10-CM

## 2012-11-05 DIAGNOSIS — M545 Low back pain: Secondary | ICD-10-CM

## 2012-11-05 LAB — POCT URINALYSIS DIPSTICK
Bilirubin, UA: NEGATIVE
Glucose, UA: NEGATIVE
Ketones, UA: NEGATIVE
Leukocytes, UA: NEGATIVE
Nitrite, UA: NEGATIVE
Protein, UA: NEGATIVE
Urobilinogen, UA: NEGATIVE

## 2012-11-05 LAB — POCT UA - MICROSCOPIC ONLY
Bacteria, U Microscopic: NEGATIVE
Crystals, Ur, HPF, POC: NEGATIVE
WBC, Ur, HPF, POC: NEGATIVE

## 2012-11-05 MED ORDER — GLUCOSE BLOOD VI STRP: ORAL_STRIP | Status: DC

## 2012-11-05 MED ORDER — NAPROXEN 500 MG PO TABS: 500.0000 mg | ORAL_TABLET | Freq: Two times a day (BID) | ORAL | Status: DC

## 2012-11-05 NOTE — Patient Instructions (Signed)
Arthritis - Gout Gout is caused by uric acid crystals forming in the joint. The big toe, foot, ankle, and knee are the joints most often affected.Often uric acid levels in the blood are also elevated. Symptoms develop rapidly, usually over hours. A joint fluid exam may be needed to prove the diagnosis. Acute gout episodes may follow a minor injury, surgery, illness or medication change. Gout occurs more often in men. It tends to be an inherited (passed down from a family member) condition. Your chance of having gout is increased if you take certain medications. These include water pills (diuretics), aspirin, niacin, and cyclosporine. Kidney disease and alcohol consumption may also increase your chances of getting gout. Months or years can pass between gout attacks.  Treatment includes ice, rest and raising the affected limb until the swelling and pain are better.Anti-inflammatory medicine usually brings about dramatic relief of pain, redness and swelling within 2 to 3 days. Other medications can also be effective. Increase your fluid intake. Do not drink alcohol or eat liver, sweetbreads, or sardines. Long-term gout management may require medicine to lower blood uric acid levels, or changing or stopping diuretics. Please see your caregiver if your condition is not better after 1 to 2 days of treatment.  SEEK IMMEDIATE MEDICAL CARE IF:  You have more serious symptoms such as a fever, skin rash, diarrhea, vomiting, or other joint pains. Document Released: 02/23/2004 Document Revised: 01/04/2011 Document Reviewed: 03/08/2008 Exit[employer] Patient Information 2012 [employer]. 

## 2012-11-05 NOTE — Progress Notes (Signed)
Subjective:    Patient ID: [name], male    DOB: 25-Sep-1924, 79 y.o.   MRN: [id number]  Back Pain This is a new problem. The current episode started in the past 7 days. The problem occurs constantly. The problem has been gradually worsening since onset. The pain is present in the lumbar spine. The quality of the pain is described as aching and burning. The pain is at a severity of 5/10. The pain is mild. The pain is the same all the time. Associated symptoms include abdominal pain. Pertinent negatives include no chest pain, fever, headaches or weakness.      Review of Systems  Constitutional: Negative for fever and chills.  Respiratory: Negative for chest tightness and shortness of breath.   Cardiovascular: Negative for chest pain.  Gastrointestinal: Positive for abdominal pain. Negative for diarrhea, constipation, blood in stool and abdominal distention.  Musculoskeletal: Positive for back pain.  Neurological: Negative for dizziness, weakness and headaches.       Objective:   Physical Exam  Constitutional: He is oriented to person, place, and time. He appears well-developed and well-nourished.  Cardiovascular: Normal rate, regular rhythm and normal heart sounds.   Pulmonary/Chest: Effort normal and breath sounds normal. No respiratory distress. He exhibits no tenderness.  Abdominal: Soft. Bowel sounds are normal. He exhibits no distension. There is tenderness in the left lower quadrant. There is no CVA tenderness.  Neurological: He is alert and oriented to person, place, and time.  Skin: Skin is warm and dry.  Psychiatric: He has a normal mood and affect.   Results for orders placed in visit on 11/05/12  POCT URINALYSIS DIPSTICK      Result Value Range   Color, UA yellow     Clarity, UA clear     Glucose, UA neg     Bilirubin, UA neg     Ketones, UA neg     Spec Grav, UA 1.010     Blood, UA trace     pH, UA 7.5     Protein, UA neg     Urobilinogen, UA negative     Nitrite, UA neg     Leukocytes, UA Negative    POCT UA - MICROSCOPIC ONLY      Result Value Range   WBC, Ur, HPF, POC neg     RBC, urine, microscopic 1-5     Bacteria, U Microscopic neg     Mucus, UA neg     Epithelial cells, urine per micros occ     Crystals, Ur, HPF, POC neg     Casts, Ur, LPF, POC neg     Yeast, UA neg      [facility] reading (PRIMARY) by Coralie Keens, FNP-C, degenerative joint disease noted in spine and narrowing joint spacing.                                         Assessment & Plan:  1. Abdominal  pain, other specified site  - POCT urinalysis dipstick - POCT UA - Microscopic Only - DG Abd 1 View; Future  2. Low back pain  - DG Lumbar Spine 2-3 Views; Future - naproxen (NAPROSYN) 500 MG tablet; Take 1 tablet (500 mg total) by mouth 2 (two) times daily with a meal.  Dispense: 20 tablet; Refill: 0 - Ambulatory referral to Orthopedic Surgery  3. Creatinine elevation  - CMP14+EGFR  4. Diabetes  - glucose blood (ACCU-CHEK AVIVA PLUS) test strip; Use as instructed  Dispense: 100 each; Refill: 12 -may use a warm compress to affected area three times daily for 10-15 minutes -RTO if symptoms worsen or seek emergency medical treatement -Patient verbalized understanding Coralie Keens, FNP-C

## 2012-11-06 LAB — CMP14+EGFR
ALT: 10 [IU]/L (ref 0–44)
AST: 14 [IU]/L (ref 0–40)
Albumin/Globulin Ratio: 2 (ref 1.1–2.5)
Albumin: 4.7 g/dL (ref 3.5–4.7)
Alkaline Phosphatase: 68 [IU]/L (ref 39–117)
BUN/Creatinine Ratio: 17 (ref 10–22)
BUN: 19 mg/dL (ref 8–27)
CO2: 25 mmol/L (ref 18–29)
Calcium: 10 mg/dL (ref 8.6–10.2)
Chloride: 97 mmol/L (ref 97–108)
Creatinine, Ser: 1.09 mg/dL (ref 0.76–1.27)
GFR calc Af Amer: 71 mL/min/{1.73_m2}
GFR calc non Af Amer: 62 mL/min/{1.73_m2}
Globulin, Total: 2.4 g/dL (ref 1.5–4.5)
Glucose: 129 mg/dL — ABNORMAL HIGH (ref 65–99)
Potassium: 4.4 mmol/L (ref 3.5–5.2)
Sodium: 140 mmol/L (ref 134–144)
Total Bilirubin: 0.7 mg/dL (ref 0.0–1.2)
Total Protein: 7.1 g/dL (ref 6.0–8.5)

## 2012-11-25 ENCOUNTER — Telehealth: Payer: Self-pay | Admitting: *Deleted

## 2012-11-25 ENCOUNTER — Other Ambulatory Visit: Payer: Self-pay | Admitting: General Practice

## 2012-11-25 DIAGNOSIS — I10 Essential (primary) hypertension: Secondary | ICD-10-CM

## 2012-11-25 MED ORDER — AMLODIPINE BESYLATE 5 MG PO TABS: 5.0000 mg | ORAL_TABLET | Freq: Every day | ORAL | Status: DC

## 2012-11-25 NOTE — Telephone Encounter (Signed)
He has appointment next week but will run out of his amlodipine before then.  Please send rx in to pharmacy. Thanks

## 2012-11-25 NOTE — Telephone Encounter (Signed)
Script sent to pharmacy.

## 2012-12-02 ENCOUNTER — Ambulatory Visit (INDEPENDENT_AMBULATORY_CARE_PROVIDER_SITE_OTHER): Payer: Medicare Other | Admitting: General Practice

## 2012-12-02 ENCOUNTER — Encounter: Payer: Self-pay | Admitting: General Practice

## 2012-12-02 VITALS — BP 110/68 | HR 93 | Temp 96.7°F | Ht 74.0 in | Wt 228.0 lb

## 2012-12-02 DIAGNOSIS — Z23 Encounter for immunization: Secondary | ICD-10-CM

## 2012-12-02 DIAGNOSIS — E039 Hypothyroidism, unspecified: Secondary | ICD-10-CM

## 2012-12-02 DIAGNOSIS — I10 Essential (primary) hypertension: Secondary | ICD-10-CM

## 2012-12-02 DIAGNOSIS — E785 Hyperlipidemia, unspecified: Secondary | ICD-10-CM

## 2012-12-02 DIAGNOSIS — E119 Type 2 diabetes mellitus without complications: Secondary | ICD-10-CM

## 2012-12-02 MED ORDER — LEVOTHYROXINE SODIUM 50 MCG PO TABS: 50.0000 ug | ORAL_TABLET | Freq: Every day | ORAL | Status: DC

## 2012-12-02 MED ORDER — METFORMIN HCL 500 MG PO TABS: 500.0000 mg | ORAL_TABLET | Freq: Two times a day (BID) | ORAL | Status: DC

## 2012-12-02 MED ORDER — PRAVASTATIN SODIUM 40 MG PO TABS: 40.0000 mg | ORAL_TABLET | Freq: Every day | ORAL | Status: DC

## 2012-12-02 MED ORDER — AMLODIPINE BESYLATE 5 MG PO TABS: 5.0000 mg | ORAL_TABLET | Freq: Every day | ORAL | Status: DC

## 2012-12-02 MED ORDER — ALLOPURINOL 300 MG PO TABS: 300.0000 mg | ORAL_TABLET | Freq: Every day | ORAL | Status: DC

## 2012-12-02 MED ORDER — GLUCOSE BLOOD VI STRP: ORAL_STRIP | Status: DC

## 2012-12-02 MED ORDER — BENAZEPRIL HCL 40 MG PO TABS: 40.0000 mg | ORAL_TABLET | Freq: Every day | ORAL | Status: DC

## 2012-12-02 NOTE — Progress Notes (Signed)
  Subjective:    Patient ID: [name], male    DOB: [birth year], 79 y.o.   MRN: [id number]  HPI Patient presents today for chronic health follow up. He has a history of hypertension, hypothyroidism, diabetes, and hyperlipidemia. Reports taking medications as directed. Reports eating a low sodium diet.     Review of Systems  Constitutional: Negative for fever and chills.  Respiratory: Negative for chest tightness and shortness of breath.   Cardiovascular: Negative for chest pain and palpitations.  Gastrointestinal: Negative for abdominal pain, diarrhea, constipation and blood in stool.  Genitourinary: Negative for hematuria and difficulty urinating.  Neurological: Negative for dizziness, weakness and headaches.       Objective:   Physical Exam  Constitutional: He is oriented to person, place, and time. He appears well-developed and well-nourished.  HENT:  Head: Normocephalic and atraumatic.  Neck: Normal range of motion. Neck supple. No thyromegaly present.  Cardiovascular: Normal rate, regular rhythm and normal heart sounds.   Pulmonary/Chest: Effort normal and breath sounds normal. No respiratory distress. He exhibits no tenderness.  Lymphadenopathy:    He has no cervical adenopathy.  Neurological: He is alert and oriented to person, place, and time.  Skin: Skin is warm and dry.  Psychiatric: He has a normal mood and affect.          Assessment & Plan:  1. Hypertension  - amLODipine (NORVASC) 5 MG tablet; Take 1 tablet (5 mg total) by mouth daily.  Dispense: 30 tablet; Refill: 3  2. Hyperlipidemia  - pravastatin (PRAVACHOL) 40 MG tablet; Take 1 tablet (40 mg total) by mouth daily.  Dispense: 30 tablet; Refill: 3 - NMR, lipoprofile; Future  3. Diabetes  - glucose blood (ACCU-CHEK AVIVA PLUS) test strip; Use as instructed  Dispense: 100 each; Refill: 12 - POCT glycosylated hemoglobin (Hb A1C); Future  4. Hypothyroidism  - Thyroid Panel With TSH; Future -Continue  all current medications Labs pending F/u in 3 months Discussed exercise and diet  Patient verbalized understanding Coralie Keens, FNP-C

## 2012-12-02 NOTE — Patient Instructions (Signed)

## 2012-12-03 ENCOUNTER — Other Ambulatory Visit (INDEPENDENT_AMBULATORY_CARE_PROVIDER_SITE_OTHER): Payer: Medicare Other

## 2012-12-03 DIAGNOSIS — E119 Type 2 diabetes mellitus without complications: Secondary | ICD-10-CM

## 2012-12-03 DIAGNOSIS — E785 Hyperlipidemia, unspecified: Secondary | ICD-10-CM

## 2012-12-03 DIAGNOSIS — E039 Hypothyroidism, unspecified: Secondary | ICD-10-CM

## 2012-12-03 LAB — POCT GLYCOSYLATED HEMOGLOBIN (HGB A1C): Hemoglobin A1C: 6.3

## 2012-12-03 NOTE — Progress Notes (Signed)
Pt came in for labs only 

## 2012-12-05 LAB — NMR, LIPOPROFILE
HDL Cholesterol by NMR: 39 mg/dL — ABNORMAL LOW (ref >=40)
LDL Particle Number: 1619 nmol/L — ABNORMAL HIGH (ref <1000)
LDL Size: 20.5 nm — ABNORMAL LOW (ref >20.5)
Triglycerides by NMR: 219 mg/dL — ABNORMAL HIGH (ref <150)

## 2012-12-05 LAB — THYROID PANEL WITH TSH
Free Thyroxine Index: 1.9 (ref 1.2–4.9)
T3 Uptake Ratio: 26 % (ref 24–39)
T4, Total: 7.2 ug/dL (ref 4.5–12.0)

## 2012-12-10 ENCOUNTER — Other Ambulatory Visit: Payer: Self-pay | Admitting: General Practice

## 2012-12-10 DIAGNOSIS — E039 Hypothyroidism, unspecified: Secondary | ICD-10-CM

## 2012-12-10 MED ORDER — LEVOTHYROXINE SODIUM 75 MCG PO TABS: 75.0000 ug | ORAL_TABLET | Freq: Every day | ORAL | Status: DC

## 2013-01-06 ENCOUNTER — Ambulatory Visit: Payer: Medicare Other | Admitting: General Practice

## 2013-01-09 ENCOUNTER — Other Ambulatory Visit: Payer: Self-pay | Admitting: General Practice

## 2013-01-09 ENCOUNTER — Ambulatory Visit: Payer: Medicare Other | Admitting: Nurse Practitioner

## 2013-01-09 ENCOUNTER — Encounter: Payer: Self-pay | Admitting: General Practice

## 2013-01-09 VITALS — BP 124/77 | HR 100 | Temp 97.1°F | Ht 74.0 in | Wt 224.5 lb

## 2013-01-09 DIAGNOSIS — I10 Essential (primary) hypertension: Secondary | ICD-10-CM

## 2013-01-09 DIAGNOSIS — E119 Type 2 diabetes mellitus without complications: Secondary | ICD-10-CM

## 2013-01-09 DIAGNOSIS — E039 Hypothyroidism, unspecified: Secondary | ICD-10-CM

## 2013-01-09 DIAGNOSIS — M109 Gout, unspecified: Secondary | ICD-10-CM

## 2013-01-09 DIAGNOSIS — E785 Hyperlipidemia, unspecified: Secondary | ICD-10-CM

## 2013-01-09 MED ORDER — AMLODIPINE BESYLATE 5 MG PO TABS: 5.0000 mg | ORAL_TABLET | Freq: Every day | ORAL | Status: DC

## 2013-01-09 MED ORDER — METFORMIN HCL 500 MG PO TABS: 500.0000 mg | ORAL_TABLET | Freq: Two times a day (BID) | ORAL | Status: DC

## 2013-01-09 MED ORDER — GLUCOSE BLOOD VI STRP: ORAL_STRIP | Status: DC

## 2013-01-09 MED ORDER — PRAVASTATIN SODIUM 40 MG PO TABS: 40.0000 mg | ORAL_TABLET | Freq: Every day | ORAL | Status: DC

## 2013-01-09 MED ORDER — FUROSEMIDE 20 MG PO TABS: 20.0000 mg | ORAL_TABLET | Freq: Every day | ORAL | Status: DC

## 2013-01-09 MED ORDER — BENAZEPRIL HCL 40 MG PO TABS: 40.0000 mg | ORAL_TABLET | Freq: Every day | ORAL | Status: DC

## 2013-01-09 MED ORDER — TIOTROPIUM BROMIDE MONOHYDRATE 18 MCG IN CAPS: 18.0000 ug | ORAL_CAPSULE | Freq: Every day | RESPIRATORY_TRACT | Status: DC

## 2013-01-09 MED ORDER — ALLOPURINOL 300 MG PO TABS: 300.0000 mg | ORAL_TABLET | Freq: Every day | ORAL | Status: DC

## 2013-01-10 LAB — THYROID PANEL WITH TSH: T3 Uptake Ratio: 29 % (ref 24–39)

## 2013-01-15 ENCOUNTER — Other Ambulatory Visit: Payer: Self-pay | Admitting: General Practice

## 2013-01-15 DIAGNOSIS — R7989 Other specified abnormal findings of blood chemistry: Secondary | ICD-10-CM

## 2013-01-15 DIAGNOSIS — E039 Hypothyroidism, unspecified: Secondary | ICD-10-CM

## 2013-01-15 MED ORDER — LEVOTHYROXINE SODIUM 88 MCG PO TABS: 88.0000 ug | ORAL_TABLET | Freq: Every day | ORAL | Status: DC

## 2013-01-19 ENCOUNTER — Telehealth: Payer: Self-pay | Admitting: *Deleted

## 2013-01-19 NOTE — Telephone Encounter (Signed)
Aware,  Thyroid level increased and med strength went up . Please come back for thyroid labs in 6 weeks.

## 2013-02-17 NOTE — Progress Notes (Unsigned)
   Subjective:    Patient ID: [name], male    DOB: 1924/05/25, 80 y.o.   MRN: [id number]  HPI  This encounter was created in error - please disregard.  Review of Systems     Objective:   Physical Exam        Assessment & Plan:  Lab only

## 2013-04-06 ENCOUNTER — Other Ambulatory Visit: Payer: Self-pay | Admitting: General Practice

## 2013-04-24 ENCOUNTER — Ambulatory Visit (INDEPENDENT_AMBULATORY_CARE_PROVIDER_SITE_OTHER): Payer: Medicare HMO | Admitting: General Practice

## 2013-04-24 ENCOUNTER — Encounter: Payer: Self-pay | Admitting: General Practice

## 2013-04-24 VITALS — BP 125/76 | HR 75 | Temp 97.0°F | Ht 74.0 in | Wt 230.4 lb

## 2013-04-24 DIAGNOSIS — E785 Hyperlipidemia, unspecified: Secondary | ICD-10-CM

## 2013-04-24 DIAGNOSIS — E039 Hypothyroidism, unspecified: Secondary | ICD-10-CM

## 2013-04-24 DIAGNOSIS — M109 Gout, unspecified: Secondary | ICD-10-CM

## 2013-04-24 DIAGNOSIS — E119 Type 2 diabetes mellitus without complications: Secondary | ICD-10-CM

## 2013-04-24 DIAGNOSIS — I10 Essential (primary) hypertension: Secondary | ICD-10-CM

## 2013-04-24 MED ORDER — ALLOPURINOL 300 MG PO TABS: 300.0000 mg | ORAL_TABLET | Freq: Every day | ORAL | Status: DC

## 2013-04-24 MED ORDER — FUROSEMIDE 20 MG PO TABS: 20.0000 mg | ORAL_TABLET | Freq: Every day | ORAL | Status: DC

## 2013-04-24 MED ORDER — METFORMIN HCL 500 MG PO TABS: 500.0000 mg | ORAL_TABLET | Freq: Two times a day (BID) | ORAL | Status: DC

## 2013-04-24 MED ORDER — AMLODIPINE BESYLATE 5 MG PO TABS: 5.0000 mg | ORAL_TABLET | Freq: Every day | ORAL | Status: DC

## 2013-04-24 MED ORDER — BENAZEPRIL HCL 40 MG PO TABS: 40.0000 mg | ORAL_TABLET | Freq: Every day | ORAL | Status: DC

## 2013-04-24 MED ORDER — PRAVASTATIN SODIUM 40 MG PO TABS: 40.0000 mg | ORAL_TABLET | Freq: Every day | ORAL | Status: DC

## 2013-04-24 NOTE — Patient Instructions (Signed)

## 2013-04-24 NOTE — Progress Notes (Signed)
   Subjective:    Patient ID: [name], male    DOB: [birth year], 80 y.o.   MRN: [id number]  HPI Patient presents today for chronic health follow up. He has a history of hypertension, hypothyroidism, diabetes, and hyperlipidemia. Reports taking medications as directed. Eating a healthy diet. Checks blood sugars every morning and ranges in 120's. Denies checking blood pressures at home.      Review of Systems  Constitutional: Negative for fever and chills.  Respiratory: Negative for chest tightness and shortness of breath.   Cardiovascular: Negative for chest pain and palpitations.  Gastrointestinal: Negative for abdominal pain, diarrhea, constipation and blood in stool.  Genitourinary: Negative for hematuria and difficulty urinating.  Neurological: Negative for dizziness, weakness and headaches.       Objective:   Physical Exam  Constitutional: He is oriented to person, place, and time. He appears well-developed and well-nourished.  HENT:  Head: Normocephalic and atraumatic.  Neck: Normal range of motion. Neck supple. No thyromegaly present.  Cardiovascular: Normal rate, regular rhythm and normal heart sounds.   Pulmonary/Chest: Effort normal and breath sounds normal. No respiratory distress. He exhibits no tenderness.  Lymphadenopathy:    He has no cervical adenopathy.  Neurological: He is alert and oriented to person, place, and time.  Skin: Skin is warm and dry.  Psychiatric: He has a normal mood and affect.          Assessment & Plan:  1. Hypertension  - CMP14+EGFR - amLODipine (NORVASC) 5 MG tablet; Take 1 tablet (5 mg total) by mouth daily.  Dispense: 90 tablet; Refill: 1 - benazepril (LOTENSIN) 40 MG tablet; Take 1 tablet (40 mg total) by mouth daily.  Dispense: 90 tablet; Refill: 1 - furosemide (LASIX) 20 MG tablet; Take 1 tablet (20 mg total) by mouth daily.  Dispense: 90 tablet; Refill: 1  2. Hyperlipidemia  - Lipid panel - pravastatin (PRAVACHOL) 40 MG  tablet; Take 1 tablet (40 mg total) by mouth daily.  Dispense: 90 tablet; Refill: 1  3. Hypothyroidism  - Thyroid Panel With TSH  4. Gout  - allopurinol (ZYLOPRIM) 300 MG tablet; Take 1 tablet (300 mg total) by mouth daily.  Dispense: 90 tablet; Refill: 1  5. Diabetes  - metFORMIN (GLUCOPHAGE) 500 MG tablet; Take 1 tablet (500 mg total) by mouth 2 (two) times daily with a meal.  Dispense: 180 tablet; Refill: 1 -Continue all current medications Labs pending F/u in 3 months Discussed benefits of regular exercise and healthy eating Patient verbalized understanding Erby Pian, FNP-C

## 2013-04-25 LAB — LIPID PANEL
CHOLESTEROL TOTAL: 182 mg/dL (ref 100–199)
Chol/HDL Ratio: 4.6 ratio units (ref 0.0–5.0)
HDL: 40 mg/dL (ref >39)
LDL Calculated: 104 mg/dL — ABNORMAL HIGH (ref 0–99)
Triglycerides: 189 mg/dL — ABNORMAL HIGH (ref 0–149)
VLDL CHOLESTEROL CAL: 38 mg/dL (ref 5–40)

## 2013-04-25 LAB — CMP14+EGFR
ALT: 9 IU/L (ref 0–44)
AST: 16 IU/L (ref 0–40)
Albumin/Globulin Ratio: 2 (ref 1.1–2.5)
Albumin: 4.6 g/dL (ref 3.5–4.7)
Alkaline Phosphatase: 64 IU/L (ref 39–117)
BILIRUBIN TOTAL: 0.6 mg/dL (ref 0.0–1.2)
BUN/Creatinine Ratio: 16 (ref 10–22)
BUN: 17 mg/dL (ref 8–27)
CALCIUM: 9.7 mg/dL (ref 8.6–10.2)
CHLORIDE: 99 mmol/L (ref 97–108)
CO2: 23 mmol/L (ref 18–29)
Creatinine, Ser: 1.04 mg/dL (ref 0.76–1.27)
GFR calc non Af Amer: 65 mL/min/{1.73_m2} (ref >59)
GFR, EST AFRICAN AMERICAN: 75 mL/min/{1.73_m2} (ref >59)
Globulin, Total: 2.3 g/dL (ref 1.5–4.5)
Glucose: 118 mg/dL — ABNORMAL HIGH (ref 65–99)
Potassium: 3.9 mmol/L (ref 3.5–5.2)
SODIUM: 141 mmol/L (ref 134–144)
TOTAL PROTEIN: 6.9 g/dL (ref 6.0–8.5)

## 2013-04-25 LAB — THYROID PANEL WITH TSH
Free Thyroxine Index: 2.2 (ref 1.2–4.9)
T3 Uptake Ratio: 31 % (ref 24–39)
T4, Total: 7 ug/dL (ref 4.5–12.0)
TSH: 5.27 u[IU]/mL — ABNORMAL HIGH (ref 0.450–4.500)

## 2013-04-29 ENCOUNTER — Other Ambulatory Visit: Payer: Self-pay | Admitting: General Practice

## 2013-04-29 DIAGNOSIS — E039 Hypothyroidism, unspecified: Secondary | ICD-10-CM

## 2013-04-29 MED ORDER — LEVOTHYROXINE SODIUM 100 MCG PO TABS: 100.0000 ug | ORAL_TABLET | Freq: Every day | ORAL | Status: DC

## 2013-07-06 ENCOUNTER — Other Ambulatory Visit: Payer: Self-pay | Admitting: General Practice

## 2013-07-06 ENCOUNTER — Other Ambulatory Visit (INDEPENDENT_AMBULATORY_CARE_PROVIDER_SITE_OTHER): Payer: Medicare HMO

## 2013-07-06 ENCOUNTER — Telehealth: Payer: Self-pay | Admitting: Family Medicine

## 2013-07-06 DIAGNOSIS — E039 Hypothyroidism, unspecified: Secondary | ICD-10-CM

## 2013-07-06 MED ORDER — LEVOTHYROXINE SODIUM 100 MCG PO TABS: 100.0000 ug | ORAL_TABLET | Freq: Every day | ORAL | Status: DC

## 2013-07-06 NOTE — Telephone Encounter (Signed)
Script sent to pharmacy. Will need refills ordered pending lab results.

## 2013-07-07 LAB — THYROID PANEL WITH TSH
Free Thyroxine Index: 2.4 (ref 1.2–4.9)
T3 UPTAKE RATIO: 28 % (ref 24–39)
T4, Total: 8.5 ug/dL (ref 4.5–12.0)
TSH: 0.801 u[IU]/mL (ref 0.450–4.500)

## 2013-07-10 ENCOUNTER — Telehealth: Payer: Self-pay | Admitting: Family Medicine

## 2013-07-10 DIAGNOSIS — E039 Hypothyroidism, unspecified: Secondary | ICD-10-CM

## 2013-07-10 NOTE — Telephone Encounter (Signed)
Message copied by Waverly Ferrari on Fri Jul 10, 2013 10:20 AM ------      Message from: Aleen Sells E      Created: Thu Jul 09, 2013  4:42 PM       Please inform lab wnl. Continue current dosage of levothyroxine ------

## 2013-07-18 MED ORDER — LEVOTHYROXINE SODIUM 100 MCG PO TABS: 100.0000 ug | ORAL_TABLET | Freq: Every day | ORAL | Status: DC

## 2013-07-18 NOTE — Telephone Encounter (Signed)
Left message patient aware  

## 2013-11-23 ENCOUNTER — Ambulatory Visit (INDEPENDENT_AMBULATORY_CARE_PROVIDER_SITE_OTHER): Payer: Medicare HMO

## 2013-11-23 DIAGNOSIS — Z23 Encounter for immunization: Secondary | ICD-10-CM

## 2013-12-13 IMAGING — CR DG ABDOMEN 1V
1 series · 1 of 1 positions shown · non-contrast
Comparison: None.

CLINICAL DATA: Left lower quadrant abdominal pain

EXAM:
ABDOMEN - 1 VIEW

[view not recorded]
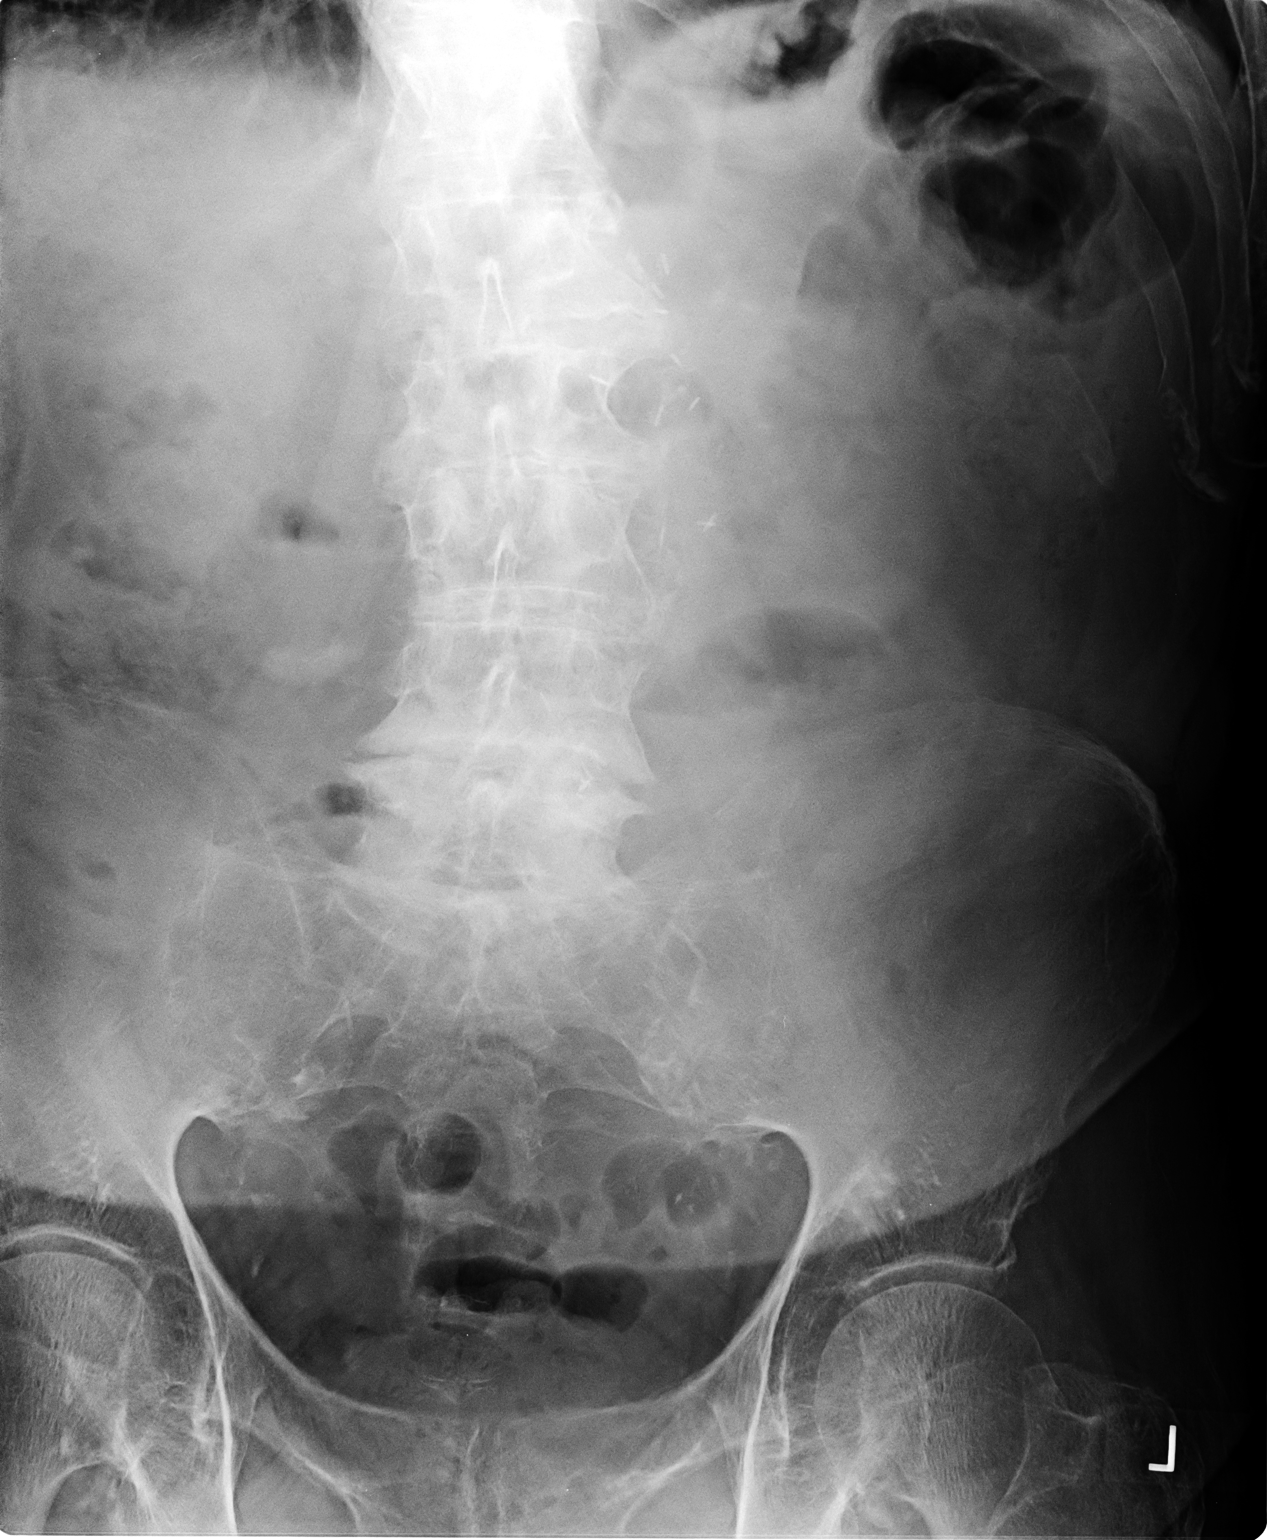

[1 of 1 positions shown; findings below may reference images not displayed]

FINDINGS: A portion of the right is abdomen is excluded from the field of
view. Bowel gas pattern is unremarkable. No evidence of renal
stones. Surgical clips are noted to the left of the lumbar spine.
There is levocurvature of the lumbar spine. Degenerative changes of
the lumbar spine are noted. Osteopenia is seen.
IMPRESSION: No acute findings.

## 2013-12-31 ENCOUNTER — Ambulatory Visit: Payer: Medicare HMO | Admitting: Family Medicine

## 2014-01-04 ENCOUNTER — Other Ambulatory Visit: Payer: Self-pay | Admitting: Family Medicine

## 2014-01-05 MED ORDER — AMLODIPINE BESYLATE 5 MG PO TABS: 5.0000 mg | ORAL_TABLET | Freq: Every day | ORAL | Status: DC

## 2014-01-05 MED ORDER — METFORMIN HCL 500 MG PO TABS: 500.0000 mg | ORAL_TABLET | Freq: Two times a day (BID) | ORAL | Status: DC

## 2014-01-05 MED ORDER — PRAVASTATIN SODIUM 40 MG PO TABS: 40.0000 mg | ORAL_TABLET | Freq: Every day | ORAL | Status: DC

## 2014-01-05 MED ORDER — LEVOTHYROXINE SODIUM 100 MCG PO TABS: 100.0000 ug | ORAL_TABLET | Freq: Every day | ORAL | Status: DC

## 2014-01-05 MED ORDER — BENAZEPRIL HCL 40 MG PO TABS: 40.0000 mg | ORAL_TABLET | Freq: Every day | ORAL | Status: DC

## 2014-01-05 MED ORDER — ALLOPURINOL 300 MG PO TABS: 300.0000 mg | ORAL_TABLET | Freq: Every day | ORAL | Status: DC

## 2014-01-05 MED ORDER — FUROSEMIDE 20 MG PO TABS: 20.0000 mg | ORAL_TABLET | Freq: Every day | ORAL | Status: DC

## 2014-01-05 NOTE — Telephone Encounter (Signed)
done

## 2014-01-13 LAB — HM DIABETES EYE EXAM

## 2014-02-11 ENCOUNTER — Encounter: Payer: Self-pay | Admitting: *Deleted

## 2014-03-10 ENCOUNTER — Ambulatory Visit (INDEPENDENT_AMBULATORY_CARE_PROVIDER_SITE_OTHER): Payer: Commercial Managed Care - HMO | Admitting: Family Medicine

## 2014-03-10 ENCOUNTER — Encounter: Payer: Self-pay | Admitting: Family Medicine

## 2014-03-10 VITALS — BP 139/82 | HR 88 | Temp 97.0°F | Ht 74.0 in | Wt 216.0 lb

## 2014-03-10 DIAGNOSIS — I10 Essential (primary) hypertension: Secondary | ICD-10-CM | POA: Diagnosis not present

## 2014-03-10 DIAGNOSIS — E118 Type 2 diabetes mellitus with unspecified complications: Secondary | ICD-10-CM | POA: Diagnosis not present

## 2014-03-10 DIAGNOSIS — M1990 Unspecified osteoarthritis, unspecified site: Secondary | ICD-10-CM

## 2014-03-10 DIAGNOSIS — J449 Chronic obstructive pulmonary disease, unspecified: Secondary | ICD-10-CM | POA: Diagnosis not present

## 2014-03-10 LAB — POCT UA - MICROALBUMIN: MICROALBUMIN (UR) POC: 20 mg/L

## 2014-03-10 LAB — POCT GLYCOSYLATED HEMOGLOBIN (HGB A1C): Hemoglobin A1C: 6.2

## 2014-03-10 NOTE — Progress Notes (Signed)
 Subjective:    Patient ID: [name], male    DOB: 07/11/1924, 81 y.o.   MRN: [id number]  HPI  81-year-old gentleman who lives with his daughter presents today to follow-up diabetes hypertension and COPD. He was given a Spiriva inhaler for his COPD but does not use it. Regarding diabetes he takes metformin and monitors his sugar at home. Numbers generally are in the 90-110 range. For his blood pressure he takes amlodipine 5 mg and benazepril to 40 mg. He is also on 40 mg of Lasix for reasons that are unclear. I have asked the daughter to experiment with the Lasix laying him regularly and using Lasix if his weight was up more than 2 pounds from one day to the next or using it if he becomes short of breath and she is agreeable to doing this.  Patient Active Problem List   Diagnosis Date Noted  . DIABETES MELLITUS 04/15/2008  . HYPERTENSION, UNSPECIFIED 04/15/2008  . COPD 04/15/2008  . ARTHRITIS 04/15/2008  . CHEST PAIN 04/15/2008  . ABDOMINAL AORTIC ANEURYSM REPAIR, HX OF 04/15/2008   Outpatient Encounter Prescriptions as of 03/10/2014  Medication Sig  . allopurinol (ZYLOPRIM) 300 MG tablet Take 1 tablet (300 mg total) by mouth daily.  . amLODipine (NORVASC) 5 MG tablet Take 1 tablet (5 mg total) by mouth daily.  . benazepril (LOTENSIN) 40 MG tablet Take 1 tablet (40 mg total) by mouth daily.  . furosemide (LASIX) 20 MG tablet Take 1 tablet (20 mg total) by mouth daily.  . glucose blood (ACCU-CHEK AVIVA PLUS) test strip Use as instructed  . levothyroxine (SYNTHROID, LEVOTHROID) 100 MCG tablet Take 1 tablet (100 mcg total) by mouth daily before breakfast.  . metFORMIN (GLUCOPHAGE) 500 MG tablet Take 1 tablet (500 mg total) by mouth 2 (two) times daily with a meal.  . pravastatin (PRAVACHOL) 40 MG tablet Take 1 tablet (40 mg total) by mouth daily.  . [DISCONTINUED] naproxen (NAPROSYN) 500 MG tablet Take 1 tablet (500 mg total) by mouth 2 (two) times daily with a meal.  . [DISCONTINUED]  tiotropium (SPIRIVA) 18 MCG inhalation capsule Place 1 capsule (18 mcg total) into inhaler and inhale daily.     Review of Systems  Constitutional: Negative.   HENT: Negative.   Respiratory: Negative.   Cardiovascular: Negative.   Gastrointestinal: Negative.   Genitourinary: Negative.   Neurological: Negative.        Objective:   Physical Exam  Constitutional: He is oriented to person, place, and time. He appears well-developed and well-nourished.  HENT:  Head: Normocephalic.  Eyes: Pupils are equal, round, and reactive to light.  Cardiovascular: Normal rate and regular rhythm.   Pulmonary/Chest: Effort normal and breath sounds normal.  Musculoskeletal: Normal range of motion.  Neurological: He is alert and oriented to person, place, and time.    BP 139/82 mmHg  Pulse 88  Temp(Src) 97 F (36.1 C) (Oral)  Ht 6' 2" (1.88 m)  Wt 216 lb (97.977 kg)  BMI 27.72 kg/m2       Assessment & Plan:  1. Essential hypertension Continue with same medicines as blood pressure is acceptable for age - Lipid panel  2. Type 2 diabetes mellitus with complication Continue with metformin - POCT glycosylated hemoglobin (Hb A1C) - CMP14+EGFR - POCT UA - Microalbumin  3. Arthritis   4. Chronic obstructive pulmonary disease, unspecified COPD, unspecified chronic bronchitis type Breathing does not seem to be a problem as he does not use an inhaler    Wardell Honour MD

## 2014-03-11 ENCOUNTER — Telehealth: Payer: Self-pay | Admitting: *Deleted

## 2014-03-11 LAB — CMP14+EGFR
ALT: 7 IU/L (ref 0–44)
AST: 12 IU/L (ref 0–40)
Albumin/Globulin Ratio: 1.8 (ref 1.1–2.5)
Albumin: 4.6 g/dL (ref 3.5–4.7)
Alkaline Phosphatase: 93 IU/L (ref 39–117)
BUN / CREAT RATIO: 17 (ref 10–22)
BUN: 15 mg/dL (ref 8–27)
Bilirubin Total: 0.5 mg/dL (ref 0.0–1.2)
CO2: 26 mmol/L (ref 18–29)
Calcium: 9.7 mg/dL (ref 8.6–10.2)
Chloride: 102 mmol/L (ref 97–108)
Creatinine, Ser: 0.9 mg/dL (ref 0.76–1.27)
GFR calc Af Amer: 88 mL/min/{1.73_m2} (ref >59)
GFR, EST NON AFRICAN AMERICAN: 77 mL/min/{1.73_m2} (ref >59)
GLOBULIN, TOTAL: 2.5 g/dL (ref 1.5–4.5)
Glucose: 118 mg/dL — ABNORMAL HIGH (ref 65–99)
POTASSIUM: 3.8 mmol/L (ref 3.5–5.2)
SODIUM: 144 mmol/L (ref 134–144)
Total Protein: 7.1 g/dL (ref 6.0–8.5)

## 2014-03-11 LAB — LIPID PANEL
CHOLESTEROL TOTAL: 170 mg/dL (ref 100–199)
Chol/HDL Ratio: 4.5 ratio units (ref 0.0–5.0)
HDL: 38 mg/dL — ABNORMAL LOW (ref >39)
LDL Calculated: 99 mg/dL (ref 0–99)
Triglycerides: 165 mg/dL — ABNORMAL HIGH (ref 0–149)
VLDL CHOLESTEROL CAL: 33 mg/dL (ref 5–40)

## 2014-03-11 MED ORDER — AMLODIPINE BESYLATE 5 MG PO TABS: 5.0000 mg | ORAL_TABLET | Freq: Every day | ORAL | Status: DC

## 2014-03-11 MED ORDER — ALLOPURINOL 300 MG PO TABS: 300.0000 mg | ORAL_TABLET | Freq: Every day | ORAL | Status: DC

## 2014-03-11 MED ORDER — FUROSEMIDE 20 MG PO TABS: 20.0000 mg | ORAL_TABLET | Freq: Every day | ORAL | Status: DC

## 2014-03-11 MED ORDER — METFORMIN HCL 500 MG PO TABS: 500.0000 mg | ORAL_TABLET | Freq: Two times a day (BID) | ORAL | Status: DC

## 2014-03-11 MED ORDER — LEVOTHYROXINE SODIUM 100 MCG PO TABS: 100.0000 ug | ORAL_TABLET | Freq: Every day | ORAL | Status: DC

## 2014-03-11 MED ORDER — BENAZEPRIL HCL 40 MG PO TABS: 40.0000 mg | ORAL_TABLET | Freq: Every day | ORAL | Status: DC

## 2014-03-11 MED ORDER — PRAVASTATIN SODIUM 40 MG PO TABS: 40.0000 mg | ORAL_TABLET | Freq: Every day | ORAL | Status: DC

## 2014-03-11 NOTE — Addendum Note (Signed)
Addended by: Marylin Crosby on: 03/11/2014 03:50 PM   Modules accepted: Orders

## 2014-03-11 NOTE — Telephone Encounter (Signed)
lmtcb regarding test results. 

## 2014-03-11 NOTE — Telephone Encounter (Signed)
-----   Message from Wardell Honour, MD sent at 03/11/2014  2:11 PM EST ----- All labs are good; no change recommendedp

## 2014-03-12 ENCOUNTER — Ambulatory Visit: Payer: Medicare HMO | Admitting: Family Medicine

## 2014-04-05 ENCOUNTER — Ambulatory Visit (INDEPENDENT_AMBULATORY_CARE_PROVIDER_SITE_OTHER): Payer: Commercial Managed Care - HMO | Admitting: Nurse Practitioner

## 2014-04-05 ENCOUNTER — Encounter: Payer: Self-pay | Admitting: Nurse Practitioner

## 2014-04-05 VITALS — BP 159/74 | HR 74 | Temp 97.0°F | Ht 74.0 in | Wt 221.0 lb

## 2014-04-05 DIAGNOSIS — E1140 Type 2 diabetes mellitus with diabetic neuropathy, unspecified: Secondary | ICD-10-CM | POA: Diagnosis not present

## 2014-04-05 DIAGNOSIS — T5494XA Toxic effect of unspecified corrosive substance, undetermined, initial encounter: Secondary | ICD-10-CM | POA: Diagnosis not present

## 2014-04-05 DIAGNOSIS — E1142 Type 2 diabetes mellitus with diabetic polyneuropathy: Secondary | ICD-10-CM

## 2014-04-05 DIAGNOSIS — T25429A Corrosion of unspecified degree of unspecified foot, initial encounter: Secondary | ICD-10-CM

## 2014-04-05 MED ORDER — GLUCOSE BLOOD VI STRP: ORAL_STRIP | Status: DC

## 2014-04-05 MED ORDER — SULFAMETHOXAZOLE-TRIMETHOPRIM 800-160 MG PO TABS: 1.0000 | ORAL_TABLET | Freq: Two times a day (BID) | ORAL | Status: DC

## 2014-04-05 MED ORDER — SILVER SULFADIAZINE 1 % EX CREA: 1.0000 "application " | TOPICAL_CREAM | Freq: Every day | CUTANEOUS | Status: DC
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
## 2014-04-05 NOTE — Addendum Note (Signed)
Addended by: Chevis Pretty on: 04/05/2014 03:46 PM   Modules accepted: Orders, Medications

## 2014-04-05 NOTE — Patient Instructions (Signed)
Chemical Burn Many chemicals can burn the skin. A chemical burn should be flushed with cool water and checked by an emergency caregiver. Your skin is a natural barrier to infection. It is the largest organ of your body. Burns damage this natural protection. To help prevent infection, it is very important to follow your caregiver's instructions in the care of your burn.  Many industrial chemicals may cause burns. These chemicals include acids, alkalis, and organic compounds such as petroleum, phenol, bitumen, tar, and grease. When acids come in contact with the skin, they cause an immediate change in the skin.Acid burns produce significant pain and form a scab (eschar). Usually, the immediate skin changes are the only damage from an acid burn.However, exposure to formic acid, chromic acid, or hydrofluoric acid may affect the whole body and may even be life-threatening. Alkalis include lye, cement, lime, and many chemicals with "hydroxide" in their name.An alkali burn may be less apparent than an acid burn at first. However, alkalis may cause greater tissue damage.It is important to be aware of any chemicals you are using. Treat any exposure to skin, eyes, or mucous membranes (nose, mouth, throat) as a potential emergency. PREVENTION  Avoid exposure to toxic chemicals that can cause burns.  Store chemicals out of the reach of children.  Use protective gloves when handling dangerous chemicals. HOME CARE INSTRUCTIONS   Wash your hands well before changing your bandage.  Change your bandage as often as directed by your caregiver.  Remove the old bandage. If the bandage sticks, you may soak it off with cool, clean water.  Cleanse the burn thoroughly but gently with mild soap and water.  Pat the area dry with a clean, dry cloth.  Apply a thin layer of antibacterial cream to the burn.  Apply a clean bandage as instructed by your caregiver.  Keep the bandage as clean and dry as  possible.  Elevate the affected area for the first 24 hours, then as instructed by your caregiver.  Only take over-the-counter or prescription medicines for pain, discomfort, or fever as directed by your caregiver.  Keep all follow-up appointments.This is important. This is how your caregiver can tell if your treatment is working. SEEK IMMEDIATE MEDICAL CARE IF:   You develop excessive pain.  You develop redness, tenderness, swelling, or red streaks near the burn.  The burned area develops yellowish-white fluid (pus) or a bad smell.  You have a fever. MAKE SURE YOU:   Understand these instructions.  Will watch your condition.  Will get help right away if you are not doing well or get worse. Document Released: 10/22/2003 Document Revised: 04/09/2011 Document Reviewed: 06/12/2010 [facility] Patient Information 2015 [place], [place]. This information is not intended to replace advice given to you by your health care provider. Make sure you discuss any questions you have with your health care provider.

## 2014-04-05 NOTE — Addendum Note (Signed)
Addended by: Chevis Pretty on: 04/05/2014 03:43 PM   Modules accepted: Orders

## 2014-07-16 ENCOUNTER — Encounter: Payer: Self-pay | Admitting: Family Medicine

## 2014-07-16 ENCOUNTER — Ambulatory Visit (INDEPENDENT_AMBULATORY_CARE_PROVIDER_SITE_OTHER): Payer: Commercial Managed Care - HMO | Admitting: Family Medicine

## 2014-07-16 VITALS — BP 170/78 | HR 60 | Temp 97.0°F | Ht 74.0 in | Wt 216.0 lb

## 2014-07-16 DIAGNOSIS — E039 Hypothyroidism, unspecified: Secondary | ICD-10-CM | POA: Diagnosis not present

## 2014-07-16 DIAGNOSIS — J449 Chronic obstructive pulmonary disease, unspecified: Secondary | ICD-10-CM

## 2014-07-16 DIAGNOSIS — E118 Type 2 diabetes mellitus with unspecified complications: Secondary | ICD-10-CM

## 2014-07-16 DIAGNOSIS — I10 Essential (primary) hypertension: Secondary | ICD-10-CM

## 2014-07-16 DIAGNOSIS — M109 Gout, unspecified: Secondary | ICD-10-CM

## 2014-07-16 LAB — POCT GLYCOSYLATED HEMOGLOBIN (HGB A1C): Hemoglobin A1C: 5.8

## 2014-07-16 MED ORDER — ALLOPURINOL 300 MG PO TABS: 300.0000 mg | ORAL_TABLET | Freq: Every day | ORAL | Status: DC

## 2014-07-16 MED ORDER — METFORMIN HCL 500 MG PO TABS: 500.0000 mg | ORAL_TABLET | Freq: Two times a day (BID) | ORAL | Status: DC

## 2014-07-16 MED ORDER — PRAVASTATIN SODIUM 40 MG PO TABS: 40.0000 mg | ORAL_TABLET | Freq: Every day | ORAL | Status: DC

## 2014-07-16 MED ORDER — AMLODIPINE BESYLATE 5 MG PO TABS: 5.0000 mg | ORAL_TABLET | Freq: Every day | ORAL | Status: DC

## 2014-07-16 MED ORDER — FUROSEMIDE 20 MG PO TABS: 20.0000 mg | ORAL_TABLET | Freq: Every day | ORAL | Status: DC

## 2014-07-16 MED ORDER — BENAZEPRIL HCL 40 MG PO TABS: 40.0000 mg | ORAL_TABLET | Freq: Every day | ORAL | Status: DC

## 2014-07-16 NOTE — Progress Notes (Signed)
   Subjective:    Patient ID: [name], male    DOB: 31-May-1924, 81 y.o.   MRN: [id number]  HPI 81 year old gentleman here to follow-up hypertension and diabetes. He has no specific complaints. He monitors his sugar and generally it's less than 120 fasting. In February we checked his lipids and they were at goal. His blood pressure is slightly elevated today and I have asked his daughter who is with him to monitor that.  Patient Active Problem List   Diagnosis Date Noted  . Diabetes 04/15/2008  . Essential hypertension 04/15/2008  . COPD (chronic obstructive pulmonary disease) 04/15/2008  . Arthritis 04/15/2008  . CHEST PAIN 04/15/2008  . ABDOMINAL AORTIC ANEURYSM REPAIR, HX OF 04/15/2008   Outpatient Encounter Prescriptions as of 07/16/2014  Medication Sig  . allopurinol (ZYLOPRIM) 300 MG tablet Take 1 tablet (300 mg total) by mouth daily.  Marland Kitchen amLODipine (NORVASC) 5 MG tablet Take 1 tablet (5 mg total) by mouth daily.  . benazepril (LOTENSIN) 40 MG tablet Take 1 tablet (40 mg total) by mouth daily.  . furosemide (LASIX) 20 MG tablet Take 1 tablet (20 mg total) by mouth daily.  Marland Kitchen glucose blood (ACCU-CHEK AVIVA PLUS) test strip Test 1x per day and prn  . glucose blood (ACCU-CHEK AVIVA PLUS) test strip Test 1x per day and prn  . levothyroxine (SYNTHROID, LEVOTHROID) 100 MCG tablet Take 1 tablet (100 mcg total) by mouth daily before breakfast.  . metFORMIN (GLUCOPHAGE) 500 MG tablet Take 1 tablet (500 mg total) by mouth 2 (two) times daily with a meal.  . pravastatin (PRAVACHOL) 40 MG tablet Take 1 tablet (40 mg total) by mouth daily.  . [DISCONTINUED] silver sulfADIAZINE (SILVADENE) 1 % cream Apply 1 application topically daily.  . [DISCONTINUED] sulfamethoxazole-trimethoprim (BACTRIM DS) 800-160 MG per tablet Take 1 tablet by mouth 2 (two) times daily.   No facility-administered encounter medications on file as of 07/16/2014.       Review of Systems  Constitutional: Negative.     Respiratory: Negative.   Cardiovascular: Negative.   Gastrointestinal: Negative.   Genitourinary: Negative.   Neurological: Negative.   Psychiatric/Behavioral: Negative.        Objective:   Physical Exam  Constitutional: He is oriented to person, place, and time. He appears well-developed and well-nourished.  Cardiovascular: Normal rate and regular rhythm.   Pulmonary/Chest: Effort normal and breath sounds normal.  Neurological: He is alert and oriented to person, place, and time.  Psychiatric: He has a normal mood and affect.    Blood pressure 170/78, pulse 60, temperature 97 F (36.1 C), temperature source Oral, height 6\' 2"  (1.88 m), weight 216 lb (97.977 kg).       Assessment & Plan:  1. Type 2 diabetes mellitus with complication Continue with metformin as A1c has been good and expect same today - POCT glycosylated hemoglobin (Hb A1C)  2. Chronic obstructive pulmonary disease, unspecified COPD, unspecified chronic bronchitis type Patient denies shortness of breath and he is not on any medications for this  3. Essential hypertension Noted above, blood pressure is elevated systolically today. Daughter to monitor pressure at home and if it is consistently at this level need to make some adjustments  4. Hypothyroidism, unspecified hypothyroidism type [name] last checked 1 year ago.  Wardell Honour MD - TSH

## 2014-07-17 LAB — URIC ACID: Uric Acid: 3.7 mg/dL (ref 3.7–8.6)

## 2014-07-17 LAB — TSH: TSH: 1.14 u[IU]/mL (ref 0.450–4.500)

## 2014-07-19 MED ORDER — LEVOTHYROXINE SODIUM 100 MCG PO TABS: 100.0000 ug | ORAL_TABLET | Freq: Every day | ORAL | Status: DC

## 2014-07-19 NOTE — Addendum Note (Signed)
Addended by: Ilean China on: 07/19/2014 03:30 PM   Modules accepted: Orders

## 2014-07-21 ENCOUNTER — Ambulatory Visit (INDEPENDENT_AMBULATORY_CARE_PROVIDER_SITE_OTHER): Payer: Commercial Managed Care - HMO | Admitting: Family Medicine

## 2014-07-21 ENCOUNTER — Encounter: Payer: Self-pay | Admitting: Family Medicine

## 2014-07-21 VITALS — BP 127/73 | HR 103 | Temp 97.7°F | Ht 74.0 in | Wt 216.0 lb

## 2014-07-21 DIAGNOSIS — R238 Other skin changes: Secondary | ICD-10-CM | POA: Diagnosis not present

## 2014-07-21 DIAGNOSIS — R234 Changes in skin texture: Secondary | ICD-10-CM

## 2014-07-21 NOTE — Progress Notes (Signed)
Uric acid, TSH, are okay

## 2014-07-21 NOTE — Progress Notes (Signed)
   Subjective:    Patient ID: [name], male    DOB: Feb 16, 1924, 81 y.o.   MRN: [id number]  HPI 81 year old gentleman with a skin problem of his left forearm. On the flexor service of his arm there is an area where the skin superficially seems like it's not connected to the underlying tissues. An appearance is very wrinkled and there is some discoloration. He does not recall any trauma to the area. There is no pain or pruritus.  Patient Active Problem List   Diagnosis Date Noted  . Gout   . Diabetes 04/15/2008  . Essential hypertension 04/15/2008  . COPD (chronic obstructive pulmonary disease) 04/15/2008  . Arthritis 04/15/2008  . CHEST PAIN 04/15/2008  . ABDOMINAL AORTIC ANEURYSM REPAIR, HX OF 04/15/2008   Outpatient Encounter Prescriptions as of 07/21/2014  Medication Sig  . allopurinol (ZYLOPRIM) 300 MG tablet Take 1 tablet (300 mg total) by mouth daily.  Marland Kitchen amLODipine (NORVASC) 5 MG tablet Take 1 tablet (5 mg total) by mouth daily.  . benazepril (LOTENSIN) 40 MG tablet Take 1 tablet (40 mg total) by mouth daily.  . furosemide (LASIX) 20 MG tablet Take 1 tablet (20 mg total) by mouth daily.  Marland Kitchen glucose blood (ACCU-CHEK AVIVA PLUS) test strip Test 1x per day and prn  . glucose blood (ACCU-CHEK AVIVA PLUS) test strip Test 1x per day and prn  . levothyroxine (SYNTHROID, LEVOTHROID) 100 MCG tablet Take 1 tablet (100 mcg total) by mouth daily before breakfast.  . metFORMIN (GLUCOPHAGE) 500 MG tablet Take 1 tablet (500 mg total) by mouth 2 (two) times daily with a meal.  . pravastatin (PRAVACHOL) 40 MG tablet Take 1 tablet (40 mg total) by mouth daily.   No facility-administered encounter medications on file as of 07/21/2014.      Review of Systems  Constitutional: Negative.   Respiratory: Negative.   Cardiovascular: Negative.   Skin: Positive for color change.       Objective:   Physical Exam  Constitutional: He appears well-developed and well-nourished.  Skin:  Area of left  forearm with very poor skin turgor. There appears to be broken vein and a venous lake in the middle and some brawny discoloration throughout the area.    BP 127/73 mmHg  Pulse 103  Temp(Src) 97.7 F (36.5 C) (Oral)  Ht 6\' 2"  (1.88 m)  Wt 216 lb (97.977 kg)  BMI 27.72 kg/m2       Assessment & Plan:  1. Loss of skin turgor Etiology unknown my theory is that he had some minor trauma and probably had some soft tissue swelling that has now resolved and left the skin discolored and with poor turgor. My guess is this will resolve without any treatment  Wardell Honour MD

## 2015-01-11 ENCOUNTER — Encounter: Payer: Self-pay | Admitting: Family Medicine

## 2015-01-11 ENCOUNTER — Ambulatory Visit (INDEPENDENT_AMBULATORY_CARE_PROVIDER_SITE_OTHER): Payer: Commercial Managed Care - HMO | Admitting: Family Medicine

## 2015-01-11 VITALS — BP 138/75 | HR 84 | Temp 96.7°F | Ht 74.0 in | Wt 218.0 lb

## 2015-01-11 DIAGNOSIS — E1140 Type 2 diabetes mellitus with diabetic neuropathy, unspecified: Secondary | ICD-10-CM | POA: Diagnosis not present

## 2015-01-11 DIAGNOSIS — J449 Chronic obstructive pulmonary disease, unspecified: Secondary | ICD-10-CM | POA: Diagnosis not present

## 2015-01-11 DIAGNOSIS — I10 Essential (primary) hypertension: Secondary | ICD-10-CM

## 2015-01-11 LAB — POCT GLYCOSYLATED HEMOGLOBIN (HGB A1C): Hemoglobin A1C: 6

## 2015-01-11 MED ORDER — PRAVASTATIN SODIUM 40 MG PO TABS: 40.0000 mg | ORAL_TABLET | Freq: Every day | ORAL | Status: DC

## 2015-01-11 MED ORDER — BENAZEPRIL HCL 40 MG PO TABS: 40.0000 mg | ORAL_TABLET | Freq: Every day | ORAL | Status: DC

## 2015-01-11 MED ORDER — LEVOTHYROXINE SODIUM 100 MCG PO TABS: 100.0000 ug | ORAL_TABLET | Freq: Every day | ORAL | Status: DC

## 2015-01-11 MED ORDER — ALLOPURINOL 300 MG PO TABS: 300.0000 mg | ORAL_TABLET | Freq: Every day | ORAL | Status: DC

## 2015-01-11 MED ORDER — GLUCOSE BLOOD VI STRP: ORAL_STRIP | Status: DC

## 2015-01-11 MED ORDER — AMLODIPINE BESYLATE 5 MG PO TABS: 5.0000 mg | ORAL_TABLET | Freq: Every day | ORAL | Status: DC

## 2015-01-11 MED ORDER — METFORMIN HCL 500 MG PO TABS: 500.0000 mg | ORAL_TABLET | Freq: Two times a day (BID) | ORAL | Status: DC

## 2015-01-11 NOTE — Progress Notes (Signed)
Subjective:    Patient ID: [name], male    DOB: 05/14/1924, 81 y.o.   MRN: [id number]  HPI 81-year-old gentleman is here to follow-up diabetes lipids and blood pressure. He lives with his daughter. He does very well appetite is good memory is intact for the most part given his age. He's had no falls. Lipids were last checked 10 months ago and were at goal and A1c was checked 6 months ago and was at goal. He denies any side effects with any medications.    Review of Systems  Constitutional: Negative.   Respiratory: Negative.   Cardiovascular: Negative.   Neurological: Negative.   Psychiatric/Behavioral: Negative.       BP 138/75 mmHg  Pulse 84  Temp(Src) 96.7 F (35.9 C) (Oral)  Ht 6' 2" (1.88 m)  Wt 218 lb (98.884 kg)  BMI 27.98 kg/m2  Objective:   Physical Exam  Constitutional: He is oriented to person, place, and time. He appears well-developed and well-nourished.  HENT:  Head: Normocephalic.  Left Ear: External ear normal.  Wears hearing aid in left ear  Cardiovascular: Normal rate, regular rhythm and normal heart sounds.   Pulmonary/Chest: Effort normal and breath sounds normal.  Abdominal: Soft.  Musculoskeletal: Normal range of motion.  Neurological: He is alert and oriented to person, place, and time.  Psychiatric: He has a normal mood and affect. His behavior is normal.          Assessment & Plan:  1. Essential hypertension  - CMP14+EGFR  2. Chronic obstructive pulmonary disease, unspecified COPD type (HCC)   3. Type 2 diabetes mellitus with diabetic neuropathy, without long-term current use of insulin (HCC)  - glucose blood (ACCU-CHEK AVIVA PLUS) test strip; Test 1x per day and prn  Dispense: 300 each; Refill: 3 - POCT glycosylated hemoglobin (Hb A1C) - Lipid panel   Stephen M Miller MD 

## 2015-01-12 LAB — CMP14+EGFR
ALBUMIN: 4.5 g/dL (ref 3.5–4.7)
ALT: 5 IU/L (ref 0–44)
AST: 11 IU/L (ref 0–40)
Albumin/Globulin Ratio: 2 (ref 1.1–2.5)
Alkaline Phosphatase: 97 IU/L (ref 39–117)
BUN / CREAT RATIO: 19 (ref 10–22)
BUN: 18 mg/dL (ref 8–27)
Bilirubin Total: 0.4 mg/dL (ref 0.0–1.2)
CALCIUM: 9.4 mg/dL (ref 8.6–10.2)
CO2: 25 mmol/L (ref 18–29)
CREATININE: 0.95 mg/dL (ref 0.76–1.27)
Chloride: 102 mmol/L (ref 96–106)
GFR calc Af Amer: 82 mL/min/{1.73_m2} (ref >59)
GFR, EST NON AFRICAN AMERICAN: 71 mL/min/{1.73_m2} (ref >59)
GLOBULIN, TOTAL: 2.2 g/dL (ref 1.5–4.5)
Glucose: 190 mg/dL — ABNORMAL HIGH (ref 65–99)
Potassium: 4 mmol/L (ref 3.5–5.2)
SODIUM: 143 mmol/L (ref 134–144)
Total Protein: 6.7 g/dL (ref 6.0–8.5)

## 2015-01-12 LAB — LIPID PANEL
CHOL/HDL RATIO: 3.8 ratio (ref 0.0–5.0)
Cholesterol, Total: 156 mg/dL (ref 100–199)
HDL: 41 mg/dL (ref >39)
LDL CALC: 87 mg/dL (ref 0–99)
Triglycerides: 138 mg/dL (ref 0–149)
VLDL Cholesterol Cal: 28 mg/dL (ref 5–40)

## 2015-01-17 ENCOUNTER — Telehealth: Payer: Self-pay | Admitting: Family Medicine

## 2015-01-19 NOTE — Telephone Encounter (Signed)
All done 01/11/15, pt aware

## 2015-02-18 LAB — HM DIABETES EYE EXAM

## 2015-03-17 ENCOUNTER — Other Ambulatory Visit: Payer: Self-pay | Admitting: Family Medicine

## 2015-05-18 ENCOUNTER — Ambulatory Visit (INDEPENDENT_AMBULATORY_CARE_PROVIDER_SITE_OTHER): Payer: Commercial Managed Care - HMO | Admitting: Family Medicine

## 2015-05-18 ENCOUNTER — Encounter: Payer: Self-pay | Admitting: Family Medicine

## 2015-05-18 VITALS — BP 165/91 | HR 76 | Temp 96.9°F | Ht 74.0 in | Wt 220.6 lb

## 2015-05-18 DIAGNOSIS — I10 Essential (primary) hypertension: Secondary | ICD-10-CM | POA: Diagnosis not present

## 2015-05-18 DIAGNOSIS — E1140 Type 2 diabetes mellitus with diabetic neuropathy, unspecified: Secondary | ICD-10-CM | POA: Diagnosis not present

## 2015-05-18 DIAGNOSIS — E039 Hypothyroidism, unspecified: Secondary | ICD-10-CM | POA: Diagnosis not present

## 2015-05-18 LAB — BAYER DCA HB A1C WAIVED: HB A1C (BAYER DCA - WAIVED): 5.9 %

## 2015-05-18 MED ORDER — LEVOTHYROXINE SODIUM 100 MCG PO TABS: 100.0000 ug | ORAL_TABLET | Freq: Every day | ORAL | Status: DC

## 2015-05-18 MED ORDER — BENAZEPRIL HCL 40 MG PO TABS: 40.0000 mg | ORAL_TABLET | Freq: Every day | ORAL | Status: DC

## 2015-05-18 MED ORDER — METFORMIN HCL 500 MG PO TABS: 500.0000 mg | ORAL_TABLET | Freq: Two times a day (BID) | ORAL | Status: DC

## 2015-05-18 MED ORDER — AMLODIPINE BESYLATE 5 MG PO TABS: 5.0000 mg | ORAL_TABLET | Freq: Every day | ORAL | Status: DC

## 2015-05-18 MED ORDER — PRAVASTATIN SODIUM 40 MG PO TABS: 40.0000 mg | ORAL_TABLET | Freq: Every day | ORAL | Status: DC

## 2015-05-18 NOTE — Progress Notes (Signed)
   Subjective:    Patient ID: [name], male    DOB: 06-23-1924, 82 y.o.   MRN: [id number]  HPI 82 year old gentleman who is followed for diabetes hypertension. He also has gout but the daughter said can no longer afford allopurinol. I do not remember her last gout flare. He also has a history of COPD but he is on no medication for his breathing.  Patient Active Problem List   Diagnosis Date Noted  . Gout   . Diabetes ([place]) 04/15/2008  . Essential hypertension 04/15/2008  . COPD (chronic obstructive pulmonary disease) ([place]) 04/15/2008  . Arthritis 04/15/2008  . CHEST PAIN 04/15/2008  . ABDOMINAL AORTIC ANEURYSM REPAIR, HX OF 04/15/2008   Outpatient Encounter Prescriptions as of 05/18/2015  Medication Sig  . allopurinol (ZYLOPRIM) 300 MG tablet Take 1 tablet (300 mg total) by mouth daily.  Marland Kitchen amLODipine (NORVASC) 5 MG tablet Take 1 tablet (5 mg total) by mouth daily.  . benazepril (LOTENSIN) 40 MG tablet Take 1 tablet (40 mg total) by mouth daily.  Marland Kitchen glucose blood (ACCU-CHEK AVIVA PLUS) test strip Test 1x per day and prn  . glucose blood (ACCU-CHEK AVIVA PLUS) test strip Test 1x per day and prn  . glucose blood (ACCU-CHEK AVIVA PLUS) test strip Test qd. Dx E11.9  . levothyroxine (SYNTHROID, LEVOTHROID) 100 MCG tablet Take 1 tablet (100 mcg total) by mouth daily before breakfast.  . metFORMIN (GLUCOPHAGE) 500 MG tablet Take 1 tablet (500 mg total) by mouth 2 (two) times daily with a meal.  . pravastatin (PRAVACHOL) 40 MG tablet Take 1 tablet (40 mg total) by mouth daily.  . [DISCONTINUED] furosemide (LASIX) 20 MG tablet Take 1 tablet (20 mg total) by mouth daily. (Patient not taking: Reported on 01/11/2015)   No facility-administered encounter medications on file as of 05/18/2015.      Review of Systems  Constitutional: Negative.   HENT: Negative.   Respiratory: Negative.   Neurological: Negative.        Objective:   Physical Exam  Constitutional: He is oriented to person,  place, and time. He appears well-developed and well-nourished.  Cardiovascular: Normal rate, regular rhythm and normal heart sounds.   Pulmonary/Chest: Effort normal and breath sounds normal.  Neurological: He is alert and oriented to person, place, and time.  Psychiatric: He has a normal mood and affect. His behavior is normal.          Assessment & Plan:  1. Essential hypertension Blood pressure well controlled on amlodipine and benazepril  2. Type 2 diabetes mellitus with diabetic neuropathy, without long-term current use of insulin (HCC) A1c today is 5.9. Continue with metformin 500 mg twice a day - Bayer DCA Hb A1c Waived  3. Hypothyroidism, unspecified hypothyroidism type Patient takes 100 mg of thyroid supplement. TSH is pending at target  Wardell Honour MD

## 2015-08-09 ENCOUNTER — Telehealth: Payer: Self-pay | Admitting: Family Medicine

## 2015-09-29 NOTE — Progress Notes (Signed)
Subjective:    Patient ID: [name], male    DOB: [birth year], 82 y.o.   MRN: [id number]  HPI 82 year old gentleman who is here to follow-up high blood pressure and diabetes. He lives with his daughter. There've been no recent falls memory is pretty good. There is some hearing loss. Appetite is good. He sleeps well.  Patient Active Problem List   Diagnosis Date Noted  . Gout   . Diabetes ([place]) 04/15/2008  . Essential hypertension 04/15/2008  . COPD (chronic obstructive pulmonary disease) ([place]) 04/15/2008  . Arthritis 04/15/2008  . CHEST PAIN 04/15/2008  . ABDOMINAL AORTIC ANEURYSM REPAIR, HX OF 04/15/2008   Outpatient Encounter Prescriptions as of 09/30/2015  Medication Sig  . amLODipine (NORVASC) 5 MG tablet Take 1 tablet (5 mg total) by mouth daily.  . benazepril (LOTENSIN) 40 MG tablet Take 1 tablet (40 mg total) by mouth daily.  Marland Kitchen glucose blood (ACCU-CHEK AVIVA PLUS) test strip Test 1x per day and prn  . levothyroxine (SYNTHROID, LEVOTHROID) 100 MCG tablet Take 1 tablet (100 mcg total) by mouth daily before breakfast.  . metFORMIN (GLUCOPHAGE) 500 MG tablet Take 1 tablet (500 mg total) by mouth 2 (two) times daily with a meal.  . pravastatin (PRAVACHOL) 40 MG tablet Take 1 tablet (40 mg total) by mouth daily.  . [DISCONTINUED] amLODipine (NORVASC) 5 MG tablet Take 1 tablet (5 mg total) by mouth daily.  . [DISCONTINUED] benazepril (LOTENSIN) 40 MG tablet Take 1 tablet (40 mg total) by mouth daily.  . [DISCONTINUED] glucose blood (ACCU-CHEK AVIVA PLUS) test strip Test 1x per day and prn  . [DISCONTINUED] glucose blood (ACCU-CHEK AVIVA PLUS) test strip Test 1x per day and prn  . [DISCONTINUED] glucose blood (ACCU-CHEK AVIVA PLUS) test strip Test qd. Dx E11.9  . [DISCONTINUED] levothyroxine (SYNTHROID, LEVOTHROID) 100 MCG tablet Take 1 tablet (100 mcg total) by mouth daily before breakfast.  . [DISCONTINUED] metFORMIN (GLUCOPHAGE) 500 MG tablet Take 1 tablet (500 mg total) by mouth 2  (two) times daily with a meal.  . [DISCONTINUED] pravastatin (PRAVACHOL) 40 MG tablet Take 1 tablet (40 mg total) by mouth daily.   No facility-administered encounter medications on file as of 09/30/2015.       Review of Systems  Constitutional: Negative.   Respiratory: Negative.   Cardiovascular: Negative.   Gastrointestinal: Negative.   Genitourinary: Negative.        Objective:   Physical Exam  Constitutional: He is oriented to person, place, and time. He appears well-developed and well-nourished.  HENT:  Mouth/Throat: Oropharynx is clear and moist.  Cardiovascular: Normal rate, regular rhythm and normal heart sounds.   Pulmonary/Chest: Effort normal and breath sounds normal.  Neurological: He is alert and oriented to person, place, and time.  Psychiatric: He has a normal mood and affect. His behavior is normal.   BP (!) 177/86 (BP Location: Right Arm, Patient Position: Sitting, Cuff Size: Large)   Pulse 78   Temp (!) 96.8 F (36 C) (Oral)   Ht 6\' 2"  (1.88 m)   Wt 224 lb 6.4 oz (101.8 kg)   BMI 28.81 kg/m         Assessment & Plan:  1. Type 2 diabetes mellitus with complication, without long-term current use of insulin (HCC) Diabetes has been mild. With low-dose metformin his last hemoglobin A1c was 5.9 - Bayer DCA Hb A1c Waived - Microalbumin / creatinine urine ratio  2. Type 2 diabetes mellitus with diabetic polyneuropathy, without long-term current use  of insulin (HCC)  - glucose blood (ACCU-CHEK AVIVA PLUS) test strip; Test 1x per day and prn  Dispense: 300 each; Refill: 3  3. Hypothyroidism, unspecified hypothyroidism type Last TSH was over one year ago. Need to reassess today  Wardell Honour MD - TSH

## 2015-09-30 ENCOUNTER — Encounter: Payer: Self-pay | Admitting: Family Medicine

## 2015-09-30 ENCOUNTER — Ambulatory Visit (INDEPENDENT_AMBULATORY_CARE_PROVIDER_SITE_OTHER): Payer: Commercial Managed Care - HMO | Admitting: Family Medicine

## 2015-09-30 VITALS — BP 177/86 | HR 78 | Temp 96.8°F | Ht 74.0 in | Wt 224.4 lb

## 2015-09-30 DIAGNOSIS — E039 Hypothyroidism, unspecified: Secondary | ICD-10-CM | POA: Diagnosis not present

## 2015-09-30 DIAGNOSIS — E1142 Type 2 diabetes mellitus with diabetic polyneuropathy: Secondary | ICD-10-CM

## 2015-09-30 DIAGNOSIS — E118 Type 2 diabetes mellitus with unspecified complications: Secondary | ICD-10-CM

## 2015-09-30 LAB — BAYER DCA HB A1C WAIVED: HB A1C (BAYER DCA - WAIVED): 6.1 % (ref <7.0)

## 2015-09-30 MED ORDER — AMLODIPINE BESYLATE 5 MG PO TABS: 5.0000 mg | ORAL_TABLET | Freq: Every day | ORAL | 1 refills | Status: DC

## 2015-09-30 MED ORDER — PRAVASTATIN SODIUM 40 MG PO TABS: 40.0000 mg | ORAL_TABLET | Freq: Every day | ORAL | 1 refills | Status: DC

## 2015-09-30 MED ORDER — GLUCOSE BLOOD VI STRP: ORAL_STRIP | 3 refills | Status: DC

## 2015-09-30 MED ORDER — METFORMIN HCL 500 MG PO TABS: 500.0000 mg | ORAL_TABLET | Freq: Two times a day (BID) | ORAL | 1 refills | Status: DC

## 2015-09-30 MED ORDER — BENAZEPRIL HCL 40 MG PO TABS: 40.0000 mg | ORAL_TABLET | Freq: Every day | ORAL | 1 refills | Status: DC

## 2015-09-30 MED ORDER — LEVOTHYROXINE SODIUM 100 MCG PO TABS: 100.0000 ug | ORAL_TABLET | Freq: Every day | ORAL | 1 refills | Status: DC

## 2015-10-01 LAB — MICROALBUMIN / CREATININE URINE RATIO
CREATININE, UR: 37.2 mg/dL
MICROALB/CREAT RATIO: 9.7 mg/g{creat} (ref 0.0–30.0)
MICROALBUM., U, RANDOM: 3.6 ug/mL

## 2015-10-01 LAB — TSH: TSH: 1.59 u[IU]/mL (ref 0.450–4.500)

## 2015-12-13 ENCOUNTER — Ambulatory Visit (INDEPENDENT_AMBULATORY_CARE_PROVIDER_SITE_OTHER): Payer: Commercial Managed Care - HMO

## 2015-12-13 DIAGNOSIS — Z23 Encounter for immunization: Secondary | ICD-10-CM

## 2016-04-16 ENCOUNTER — Other Ambulatory Visit: Payer: Self-pay | Admitting: Family Medicine

## 2016-05-02 ENCOUNTER — Encounter: Payer: Self-pay | Admitting: Pediatrics

## 2016-05-02 ENCOUNTER — Ambulatory Visit (INDEPENDENT_AMBULATORY_CARE_PROVIDER_SITE_OTHER): Payer: Medicare HMO | Admitting: Pediatrics

## 2016-05-02 VITALS — BP 148/73 | HR 59 | Temp 96.7°F | Ht 74.0 in | Wt 223.8 lb

## 2016-05-02 DIAGNOSIS — I10 Essential (primary) hypertension: Secondary | ICD-10-CM | POA: Diagnosis not present

## 2016-05-02 DIAGNOSIS — M109 Gout, unspecified: Secondary | ICD-10-CM | POA: Diagnosis not present

## 2016-05-02 DIAGNOSIS — L570 Actinic keratosis: Secondary | ICD-10-CM | POA: Diagnosis not present

## 2016-05-02 DIAGNOSIS — E118 Type 2 diabetes mellitus with unspecified complications: Secondary | ICD-10-CM

## 2016-05-02 DIAGNOSIS — E039 Hypothyroidism, unspecified: Secondary | ICD-10-CM | POA: Diagnosis not present

## 2016-05-02 LAB — BAYER DCA HB A1C WAIVED: HB A1C: 5.9 % (ref <7.0)

## 2016-05-02 NOTE — Patient Instructions (Signed)
Can use antibiotic ointment on areas frozen today A blister will form, don't pop or mess with the blister

## 2016-05-02 NOTE — Progress Notes (Signed)
  Subjective:   Patient ID: [name], male    DOB: March 09, 1924, 83 y.o.   MRN: [id number] CC: Follow-up multiple med problems  HPI: [name] is a 83 y.o. male presenting for Follow-up  HTN: No CP, no SOB Taking meds regularly  DM2:  On metformin Tolerating well  Hypothyroidism: Take levothyroxine daily  Has difficulty hearing, ongoing problem Has hearing aids Feeling well Good appetite  Gout: no recent flares, not on medications  Place on L side of neck that itches and bleeds, has been there for months  Relevant past medical, surgical, family and social history reviewed. Allergies and medications reviewed and updated. History  Smoking Status  . Former Smoker  Smokeless Tobacco  . Never Used   ROS: Per HPI   Objective:    BP (!) 148/73   Pulse (!) 59   Temp (!) 96.7 F (35.9 C) (Oral)   Ht _0  (1.88 m)   Wt 223 lb 12.8 oz (101.5 kg)   BMI 28.73 kg/m   Wt Readings from Last 3 Encounters:  05/02/16 223 lb 12.8 oz (101.5 kg)  09/30/15 224 lb 6.4 oz (101.8 kg)  05/18/15 220 lb 9.6 oz (100.1 kg)    Gen: NAD, alert, cooperative with exam, NCAT EYES: EOMI, no conjunctival injection, or no icterus ENT:   OP without erythema LYMPH: no cervical LAD CV: NRRR, normal S1/S2, no murmur, distal pulses 2+ b/l Resp: CTABL, no wheezes, normal WOB Abd: +BS, soft, NTND. no guarding or organomegaly Ext: No edema, warm Neuro: Alert and oriented, decreased sensation b/l forefoot plantar surface with monofilament testing, normal sensation to touch MSK: normal muscle bulk Skin: small rough papules b/l forearms. L side of neck with 37m flat-topped papule with small amount of scab on it, rough  Assessment & Plan:  RGailwas seen today for follow-up multiple med problems  Diagnoses and all orders for this visit:  Essential hypertension Improved with recheck, still elevated Check at home Let me know if still elevated Cont current meds -     BMP8+EGFR  Type 2 diabetes  mellitus with complication, without long-term current use of insulin ([place] Well controlled, A1c 5.9 Cont metformin Foot exam done today -     Bayer DCA Hb A1c Waived  Hypothyroidism, unspecified type Stable, cont synthroid  Gout, unspecified cause, unspecified chronicity, unspecified site Stable, no recent flares, not on medication  Actinic keratoses Discussed options, agreed to proceed with cryotherapy See below procedure note If neck lesion returns will send to dermatology  PROCEDURE Discussed risks, benefits and alternatives, pt agreed to proceed. Liquid nitrogen used to freeze total of 8 AKs, neck and b/l lower arms. neck lesion with 30 sec freeze cycle. Skin care discussed   Follow up plan: Return in about 1 month (around 06/01/2016). CAssunta Found MD [facility]

## 2016-05-03 LAB — BMP8+EGFR
BUN/Creatinine Ratio: 21 (ref 10–24)
BUN: 19 mg/dL (ref 8–27)
CALCIUM: 9.3 mg/dL (ref 8.6–10.2)
CO2: 24 mmol/L (ref 18–29)
CREATININE: 0.89 mg/dL (ref 0.76–1.27)
Chloride: 99 mmol/L (ref 96–106)
GFR calc Af Amer: 88 mL/min/{1.73_m2} (ref >59)
GFR, EST NON AFRICAN AMERICAN: 76 mL/min/{1.73_m2} (ref >59)
Glucose: 103 mg/dL — ABNORMAL HIGH (ref 65–99)
Potassium: 4 mmol/L (ref 3.5–5.2)
SODIUM: 138 mmol/L (ref 134–144)

## 2016-06-04 ENCOUNTER — Ambulatory Visit (INDEPENDENT_AMBULATORY_CARE_PROVIDER_SITE_OTHER): Payer: Medicare HMO | Admitting: Pediatrics

## 2016-06-04 ENCOUNTER — Encounter: Payer: Self-pay | Admitting: Pediatrics

## 2016-06-04 VITALS — BP 192/83 | HR 54 | Temp 96.8°F | Ht 74.0 in | Wt 225.8 lb

## 2016-06-04 DIAGNOSIS — E118 Type 2 diabetes mellitus with unspecified complications: Secondary | ICD-10-CM

## 2016-06-04 DIAGNOSIS — L570 Actinic keratosis: Secondary | ICD-10-CM | POA: Diagnosis not present

## 2016-06-04 DIAGNOSIS — I10 Essential (primary) hypertension: Secondary | ICD-10-CM

## 2016-06-04 DIAGNOSIS — Z23 Encounter for immunization: Secondary | ICD-10-CM

## 2016-06-04 NOTE — Patient Instructions (Addendum)
Come back for blood pressure recheck in 2 weeks if still elevated

## 2016-06-04 NOTE — Progress Notes (Signed)
  Subjective:   Patient ID: [name], male    DOB: 04/14/1924, 83 y.o.   MRN: [id number] CC: Follow-up (Mole Removal 1 month ago)  HPI: [name] is a 83 y.o. male presenting for Follow-up (Mole Removal 1 month ago)  HTN: took meds this morning Drank coffee right before coming Does not check BPs at home No CP, no SOB, feeling well Does not feel dizzyheaded when he stands up Used to have dizzy spells per his daughte,r family has been having him move slower and that has been helping Had just been taking 20mg  of benzapril at home, 5 mg amlodipine at night  DM2: due for eye exam On metformin, diet  AKs: healing well after cryotherapy, no further itching, bleeding  Due for tetanus, PNA shots Tetanus $68, pt wants to defer  Relevant past medical, surgical, family and social history reviewed. Allergies and medications reviewed and updated. History  Smoking Status  . Former Smoker  Smokeless Tobacco  . Never Used   ROS: Per HPI   Objective:    BP (!) 192/83 Comment: Pt to take full dose of benazepril and come back in 2 weeks  Pulse (!) 54   Temp (!) 96.8 F (36 C) (Oral)   Ht 6\' 2"  (1.88 m)   Wt 225 lb 12.8 oz (102.4 kg)   BMI 28.99 kg/m   Wt Readings from Last 3 Encounters:  06/04/16 225 lb 12.8 oz (102.4 kg)  05/02/16 223 lb 12.8 oz (101.5 kg)  09/30/15 224 lb 6.4 oz (101.8 kg)    Gen: NAD, alert, cooperative with exam, NCAT EYES: EOMI, no conjunctival injection, or no icterus CV: NRRR, normal S1/S2 Resp: CTABL, no wheezes, normal WOB Abd: +BS, soft, NTND.  Ext: No edema, warm Neuro: Alert and oriented Skin: L neck with smooth, red plaque, well healing R dorsum of hand with smooth skin, slightly bruised  Assessment & Plan:  Jaston was seen today for follow-up multiple med problems.  Diagnoses and all orders for this visit:  Type 2 diabetes mellitus with complication, without long-term current use of insulin (HCC) A1c 5.9 04/2016 On metformin alone  Actinic  keratoses Cryotherapy last visit, healing  Essential hypertension Elevated today, taking 20mg  benazepril, increase back to 40mg  rtc 2 weeks for recheck BP  Need for vaccination against Streptococcus pneumoniae using pneumococcal conjugate vaccine 13 -     Pneumococcal conjugate vaccine 13-valent  Due for pneumonia shot today  Follow up plan: Return in about 3 months (around 09/04/2016). Assunta Found, MD [place]

## 2016-06-11 DIAGNOSIS — H2513 Age-related nuclear cataract, bilateral: Secondary | ICD-10-CM | POA: Diagnosis not present

## 2016-06-11 DIAGNOSIS — H40033 Anatomical narrow angle, bilateral: Secondary | ICD-10-CM | POA: Diagnosis not present

## 2016-06-12 ENCOUNTER — Ambulatory Visit (INDEPENDENT_AMBULATORY_CARE_PROVIDER_SITE_OTHER): Payer: Medicare HMO | Admitting: Family

## 2016-06-12 ENCOUNTER — Encounter: Payer: Self-pay | Admitting: Family

## 2016-06-12 VITALS — BP 151/78 | HR 66 | Temp 96.7°F | Ht 74.0 in | Wt 222.2 lb

## 2016-06-12 DIAGNOSIS — S40922A Unspecified superficial injury of left upper arm, initial encounter: Secondary | ICD-10-CM

## 2016-06-12 DIAGNOSIS — L03114 Cellulitis of left upper limb: Secondary | ICD-10-CM

## 2016-06-12 DIAGNOSIS — Z23 Encounter for immunization: Secondary | ICD-10-CM | POA: Diagnosis not present

## 2016-06-12 MED ORDER — SULFAMETHOXAZOLE-TRIMETHOPRIM 800-160 MG PO TABS: 1.0000 | ORAL_TABLET | Freq: Two times a day (BID) | ORAL | 0 refills | Status: DC

## 2016-06-12 NOTE — Addendum Note (Signed)
Addended by: Shelbie Ammons on: 06/12/2016 09:34 AM   Modules accepted: Orders

## 2016-06-12 NOTE — Patient Instructions (Signed)

## 2016-06-12 NOTE — Progress Notes (Signed)
   Subjective:    Patient ID: [name], male    DOB: Apr 30, 1924, 83 y.o.   MRN: [id number]  HPI PT presents to the office today today with left arm swelling, erythemas, and mild tenderness. Daughter states she noticed it slightly erythemas several days ago, but it has since spread up his arm and become worse. States he was laying on the ground fixing something and hit his arm on a rock. Pt reports the skin was "broken", but looked ok then. Pt denies and discharge or fevers.    Review of Systems  Skin: Positive for rash and wound.  All other systems reviewed and are negative.      Objective:   Physical Exam  Constitutional: He is oriented to person, place, and time. He appears well-developed and well-nourished. No distress.  HENT:  Head: Normocephalic.  Cardiovascular: Normal rate, regular rhythm, normal heart sounds and intact distal pulses.   No murmur heard. Pulmonary/Chest: Effort normal and breath sounds normal. No respiratory distress. He has no wheezes.  Abdominal: Soft. Bowel sounds are normal. He exhibits no distension. There is no tenderness.  Musculoskeletal: Normal range of motion. He exhibits no edema or tenderness.  Neurological: He is alert and oriented to person, place, and time.  Skin: Skin is warm and dry. Laceration (on left forearm, 2.5X0.9cm) and rash noted. There is erythema.  cellulites of left arm, erythemas extending up to upper arm and left wrist.   Psychiatric: He has a normal mood and affect. His behavior is normal. Judgment and thought content normal.  Vitals reviewed.        Area marked.  BP (!) 151/78   Pulse 66   Temp (!) 96.7 F (35.9 C) (Oral)   Ht 6\' 2"  (1.88 m)   Wt 222 lb 3.2 oz (100.8 kg)   BMI 28.53 kg/m      Assessment & Plan:  1. Cellulitis of left upper extremity Area marked, if erythemas increases RTO Report any increase in erythemas, fever, or tenderness RTO prn  - sulfamethoxazole-trimethoprim (BACTRIM DS) 800-160 MG  tablet; Take 1 tablet by mouth 2 (two) times daily.  Dispense: 14 tablet; Refill: 0   Evelina Dun, FNP

## 2016-06-14 ENCOUNTER — Other Ambulatory Visit: Payer: Self-pay | Admitting: Family Medicine

## 2016-06-15 NOTE — Telephone Encounter (Signed)
Authorize 30 days only. Then contact the patient letting them know that they will need an appointment before any further prescriptions can be sent in. 

## 2016-06-19 ENCOUNTER — Ambulatory Visit (INDEPENDENT_AMBULATORY_CARE_PROVIDER_SITE_OTHER): Payer: Medicare HMO | Admitting: *Deleted

## 2016-06-19 VITALS — BP 134/65 | HR 64

## 2016-06-19 DIAGNOSIS — Z0130 Encounter for examination of blood pressure without abnormal findings: Secondary | ICD-10-CM

## 2016-06-19 NOTE — Progress Notes (Signed)
bp check per Dr Evette Doffing BP 134 65 P 64

## 2016-07-19 DIAGNOSIS — H2701 Aphakia, right eye: Secondary | ICD-10-CM | POA: Diagnosis not present

## 2016-07-19 DIAGNOSIS — H53141 Visual discomfort, right eye: Secondary | ICD-10-CM | POA: Diagnosis not present

## 2016-07-19 DIAGNOSIS — H2000 Unspecified acute and subacute iridocyclitis: Secondary | ICD-10-CM | POA: Diagnosis not present

## 2016-07-19 DIAGNOSIS — Z961 Presence of intraocular lens: Secondary | ICD-10-CM | POA: Diagnosis not present

## 2016-07-21 DIAGNOSIS — H2701 Aphakia, right eye: Secondary | ICD-10-CM | POA: Diagnosis not present

## 2016-07-21 DIAGNOSIS — H2000 Unspecified acute and subacute iridocyclitis: Secondary | ICD-10-CM | POA: Diagnosis not present

## 2016-07-21 DIAGNOSIS — Z961 Presence of intraocular lens: Secondary | ICD-10-CM | POA: Diagnosis not present

## 2016-08-06 ENCOUNTER — Other Ambulatory Visit: Payer: Self-pay | Admitting: Family Medicine

## 2016-09-24 ENCOUNTER — Other Ambulatory Visit: Payer: Self-pay | Admitting: Family Medicine

## 2016-10-10 ENCOUNTER — Other Ambulatory Visit: Payer: Self-pay | Admitting: Pediatrics

## 2016-12-11 ENCOUNTER — Other Ambulatory Visit: Payer: Self-pay | Admitting: Pediatrics

## 2016-12-28 ENCOUNTER — Encounter: Payer: Self-pay | Admitting: Pediatrics

## 2016-12-28 ENCOUNTER — Ambulatory Visit (INDEPENDENT_AMBULATORY_CARE_PROVIDER_SITE_OTHER): Payer: Medicare HMO | Admitting: Pediatrics

## 2016-12-28 VITALS — BP 131/87 | HR 60 | Temp 97.6°F | Ht 74.0 in | Wt 216.6 lb

## 2016-12-28 DIAGNOSIS — E785 Hyperlipidemia, unspecified: Secondary | ICD-10-CM

## 2016-12-28 DIAGNOSIS — E118 Type 2 diabetes mellitus with unspecified complications: Secondary | ICD-10-CM | POA: Diagnosis not present

## 2016-12-28 DIAGNOSIS — E1142 Type 2 diabetes mellitus with diabetic polyneuropathy: Secondary | ICD-10-CM | POA: Diagnosis not present

## 2016-12-28 DIAGNOSIS — E039 Hypothyroidism, unspecified: Secondary | ICD-10-CM | POA: Diagnosis not present

## 2016-12-28 DIAGNOSIS — I10 Essential (primary) hypertension: Secondary | ICD-10-CM | POA: Diagnosis not present

## 2016-12-28 DIAGNOSIS — E1169 Type 2 diabetes mellitus with other specified complication: Secondary | ICD-10-CM

## 2016-12-28 DIAGNOSIS — Z23 Encounter for immunization: Secondary | ICD-10-CM

## 2016-12-28 LAB — BAYER DCA HB A1C WAIVED: HB A1C: 5.8 % (ref <7.0)

## 2016-12-28 MED ORDER — AMLODIPINE BESYLATE 5 MG PO TABS: 5.0000 mg | ORAL_TABLET | Freq: Every day | ORAL | 1 refills | Status: DC

## 2016-12-28 MED ORDER — METFORMIN HCL 500 MG PO TABS: 500.0000 mg | ORAL_TABLET | Freq: Two times a day (BID) | ORAL | 1 refills | Status: DC

## 2016-12-28 MED ORDER — PRAVASTATIN SODIUM 40 MG PO TABS: 40.0000 mg | ORAL_TABLET | Freq: Every day | ORAL | 1 refills | Status: DC

## 2016-12-28 MED ORDER — LEVOTHYROXINE SODIUM 100 MCG PO TABS: ORAL_TABLET | ORAL | 1 refills | Status: DC

## 2016-12-28 MED ORDER — BENAZEPRIL HCL 40 MG PO TABS: 40.0000 mg | ORAL_TABLET | Freq: Every day | ORAL | 1 refills | Status: DC

## 2016-12-28 MED ORDER — GLUCOSE BLOOD VI STRP: ORAL_STRIP | 3 refills | Status: DC

## 2016-12-28 NOTE — Progress Notes (Signed)
  Subjective:   Patient ID: [name], male    DOB: [birth year], 83 y.o.   MRN: [id number] CC: Follow-up (6 month) Multiple med problems HPI: [name] is a 83 y.o. male presenting for Follow-up (6 month)  DM2: Checking his blood sugars every morning, low 100s Taking metformin daily, no side effects  HTN: Taking meds regularly, no chest pain Says if he is working hard such as taking a post hole after a few minutes off to stop and take a breath but otherwise has not noticed any shortness of breath with exertion Has been able to stay fairly active No falls No symptoms of claudication  Hypothyroidism: Taking medicine regularly  Relevant past medical, surgical, family and social history reviewed. Allergies and medications reviewed and updated. Social History   Tobacco Use  Smoking Status Former Smoker  Smokeless Tobacco Never Used   ROS: Per HPI   Objective:    BP 131/87   Pulse 60   Temp 97.6 F (36.4 C) (Oral)   Ht '6\' 2"'$  (1.88 m)   Wt 216 lb 9.6 oz (98.2 kg)   BMI 27.81 kg/m   Wt Readings from Last 3 Encounters:  12/28/16 216 lb 9.6 oz (98.2 kg)  06/12/16 222 lb 3.2 oz (100.8 kg)  06/04/16 225 lb 12.8 oz (102.4 kg)    Gen: NAD, alert, cooperative with exam, NCAT EYES: no conjunctival injection ENT:   OP without erythema LYMPH: no cervical LAD CV: NRRR, normal S1/S2, no murmur Resp: CTABL, no wheezes, normal WOB Abd: +BS, soft, NT, mildly distended. no guarding or organomegaly Ext: No edema, warm Neuro: Alert and oriented, coordination grossly normal Assessment & Plan:  [name] was seen today for follow-up med problems  Diagnoses and all orders for this visit:  Essential hypertension Stable, continue current meds -     BMP8+EGFR -     benazepril (LOTENSIN) 40 MG tablet; Take 1 tablet (40 mg total) by mouth daily. -     amLODipine (NORVASC) 5 MG tablet; Take 1 tablet (5 mg total) by mouth daily.  Type 2 diabetes mellitus with complication, without long-term  current use of insulin (HCC) A1c well controlled at 5.8 Continue diet control, on metformin -     Bayer DCA Hb A1c Waived  Hypothyroidism, unspecified type Stable, continue current medicine, due for recheck lab -     levothyroxine (SYNTHROID, LEVOTHROID) 100 MCG tablet; TAKE 1 TABLET EVERY DAY BEFORE BREAKFAST -     TSH  Type 2 diabetes mellitus with diabetic polyneuropathy, without long-term current use of insulin (HCC) -     glucose blood (ACCU-CHEK AVIVA PLUS) test strip; Test 1x per day and prn -     metFORMIN (GLUCOPHAGE) 500 MG tablet; Take 1 tablet (500 mg total) by mouth 2 (two) times daily with a meal.  Hyperlipidemia associated with type 2 diabetes mellitus (HCC) Stable, continue current meds Was taking daily aspirin, had frequent nosebleeds so now takes one rarely -     pravastatin (PRAVACHOL) 40 MG tablet; Take 1 tablet (40 mg total) by mouth daily.   Follow up plan: Return in about 6 months (around 06/27/2017). Assunta Found, MD [place]

## 2016-12-29 LAB — LIPID PANEL
CHOL/HDL RATIO: 3.1 ratio (ref 0.0–5.0)
CHOLESTEROL TOTAL: 141 mg/dL (ref 100–199)
HDL: 45 mg/dL (ref >39)
LDL CALC: 76 mg/dL (ref 0–99)
Triglycerides: 100 mg/dL (ref 0–149)
VLDL Cholesterol Cal: 20 mg/dL (ref 5–40)

## 2016-12-29 LAB — BMP8+EGFR
BUN / CREAT RATIO: 20 (ref 10–24)
BUN: 19 mg/dL (ref 10–36)
CO2: 24 mmol/L (ref 20–29)
CREATININE: 0.95 mg/dL (ref 0.76–1.27)
Calcium: 9.5 mg/dL (ref 8.6–10.2)
Chloride: 102 mmol/L (ref 96–106)
GFR, EST AFRICAN AMERICAN: 81 mL/min/{1.73_m2} (ref >59)
GFR, EST NON AFRICAN AMERICAN: 70 mL/min/{1.73_m2} (ref >59)
GLUCOSE: 88 mg/dL (ref 65–99)
Potassium: 4.3 mmol/L (ref 3.5–5.2)
SODIUM: 142 mmol/L (ref 134–144)

## 2016-12-29 LAB — THYROID PANEL WITH TSH
Free Thyroxine Index: 2.4 (ref 1.2–4.9)
T3 Uptake Ratio: 27 % (ref 24–39)
T4 TOTAL: 8.8 ug/dL (ref 4.5–12.0)
TSH: 1.83 u[IU]/mL (ref 0.450–4.500)

## 2017-01-01 ENCOUNTER — Other Ambulatory Visit: Payer: Self-pay | Admitting: *Deleted

## 2017-01-01 DIAGNOSIS — E1142 Type 2 diabetes mellitus with diabetic polyneuropathy: Secondary | ICD-10-CM

## 2017-01-01 MED ORDER — GLUCOSE BLOOD VI STRP: ORAL_STRIP | 3 refills | Status: DC

## 2017-03-27 DIAGNOSIS — Z0289 Encounter for other administrative examinations: Secondary | ICD-10-CM

## 2017-07-08 ENCOUNTER — Other Ambulatory Visit: Payer: Self-pay | Admitting: Pediatrics

## 2017-07-08 DIAGNOSIS — I10 Essential (primary) hypertension: Secondary | ICD-10-CM

## 2017-07-08 DIAGNOSIS — E1142 Type 2 diabetes mellitus with diabetic polyneuropathy: Secondary | ICD-10-CM

## 2017-07-17 ENCOUNTER — Other Ambulatory Visit: Payer: Self-pay | Admitting: Pediatrics

## 2017-07-17 NOTE — Telephone Encounter (Signed)
Left message for patient to call .  No astepro on medication list. Please advise if correct medication request.

## 2017-07-22 ENCOUNTER — Telehealth: Payer: Self-pay | Admitting: Pediatrics

## 2017-07-22 DIAGNOSIS — I10 Essential (primary) hypertension: Secondary | ICD-10-CM

## 2017-07-22 NOTE — Telephone Encounter (Signed)
Left message to call back  

## 2017-07-22 NOTE — Telephone Encounter (Signed)
[facility] has called for the pt, pt is scheduled on 7/29 for a med check. Pt will come in earlier in July to have blood work done, would like to have orders placed

## 2017-07-25 NOTE — Telephone Encounter (Signed)
BMP ordered

## 2017-07-25 NOTE — Telephone Encounter (Signed)
LMTCB 07/25/17 closed chart tried pt x 3

## 2017-07-26 NOTE — Telephone Encounter (Signed)
Daughter aware.

## 2017-08-09 ENCOUNTER — Other Ambulatory Visit: Payer: Medicare HMO

## 2017-08-09 DIAGNOSIS — I10 Essential (primary) hypertension: Secondary | ICD-10-CM | POA: Diagnosis not present

## 2017-08-09 LAB — BASIC METABOLIC PANEL
BUN/Creatinine Ratio: 17 (ref 10–24)
BUN: 17 mg/dL (ref 10–36)
CO2: 24 mmol/L (ref 20–29)
Calcium: 9.6 mg/dL (ref 8.6–10.2)
Chloride: 106 mmol/L (ref 96–106)
Creatinine, Ser: 1 mg/dL (ref 0.76–1.27)
GFR calc Af Amer: 76 mL/min/{1.73_m2} (ref >59)
GFR calc non Af Amer: 66 mL/min/{1.73_m2} (ref >59)
GLUCOSE: 97 mg/dL (ref 65–99)
POTASSIUM: 4.1 mmol/L (ref 3.5–5.2)
Sodium: 145 mmol/L — ABNORMAL HIGH (ref 134–144)

## 2017-08-26 ENCOUNTER — Ambulatory Visit (INDEPENDENT_AMBULATORY_CARE_PROVIDER_SITE_OTHER): Payer: Medicare HMO | Admitting: Pediatrics

## 2017-08-26 ENCOUNTER — Encounter: Payer: Self-pay | Admitting: Pediatrics

## 2017-08-26 VITALS — BP 134/83 | HR 78 | Temp 96.6°F | Ht 74.0 in | Wt 219.0 lb

## 2017-08-26 DIAGNOSIS — I10 Essential (primary) hypertension: Secondary | ICD-10-CM | POA: Diagnosis not present

## 2017-08-26 DIAGNOSIS — E785 Hyperlipidemia, unspecified: Secondary | ICD-10-CM

## 2017-08-26 DIAGNOSIS — E039 Hypothyroidism, unspecified: Secondary | ICD-10-CM | POA: Diagnosis not present

## 2017-08-26 DIAGNOSIS — E1169 Type 2 diabetes mellitus with other specified complication: Secondary | ICD-10-CM

## 2017-08-26 DIAGNOSIS — R300 Dysuria: Secondary | ICD-10-CM | POA: Diagnosis not present

## 2017-08-26 DIAGNOSIS — E1142 Type 2 diabetes mellitus with diabetic polyneuropathy: Secondary | ICD-10-CM

## 2017-08-26 LAB — URINALYSIS, COMPLETE
Bilirubin, UA: NEGATIVE
Glucose, UA: NEGATIVE
KETONES UA: NEGATIVE
Nitrite, UA: POSITIVE — AB
PH UA: 7 (ref 5.0–7.5)
PROTEIN UA: NEGATIVE
Specific Gravity, UA: 1.02 (ref 1.005–1.030)
Urobilinogen, Ur: 1 mg/dL (ref 0.2–1.0)

## 2017-08-26 LAB — MICROSCOPIC EXAMINATION

## 2017-08-26 LAB — BAYER DCA HB A1C WAIVED: HB A1C (BAYER DCA - WAIVED): 5.5 % (ref <7.0)

## 2017-08-26 MED ORDER — AMLODIPINE BESYLATE 5 MG PO TABS: 5.0000 mg | ORAL_TABLET | Freq: Every day | ORAL | 0 refills | Status: DC

## 2017-08-26 MED ORDER — BENAZEPRIL HCL 40 MG PO TABS: 40.0000 mg | ORAL_TABLET | Freq: Every day | ORAL | 0 refills | Status: DC

## 2017-08-26 MED ORDER — METFORMIN HCL 500 MG PO TABS: 500.0000 mg | ORAL_TABLET | Freq: Two times a day (BID) | ORAL | 1 refills | Status: DC

## 2017-08-26 MED ORDER — LEVOTHYROXINE SODIUM 100 MCG PO TABS: ORAL_TABLET | ORAL | 1 refills | Status: DC

## 2017-08-26 MED ORDER — PRAVASTATIN SODIUM 40 MG PO TABS: 40.0000 mg | ORAL_TABLET | Freq: Every day | ORAL | 1 refills | Status: DC

## 2017-08-26 NOTE — Progress Notes (Signed)
  Subjective:   Patient ID: [name], male    DOB: 05/13/1924, 84 y.o.   MRN: [id number] CC: Medical Management of Chronic Issues  HPI: [name] is a 84 y.o. male   Here today with his daughter  Several days ago was having some dysuria.  Symptoms much improved now mostly back to normal.  Is having some lower abdominal pain at the same time.  No fevers.  Appetite is been fine.  Hypothyroidism: Taking medicine regularly  Hypertension: Taking medicine regularly.  No shortness of breath or chest pain  Diabetes: On metformin alone.  Trying to avoid sugary foods.  Hyperlipidemia: Taking his medicine regularly.  No side effects.  Relevant past medical, surgical, family and social history reviewed. Allergies and medications reviewed and updated. Social History   Tobacco Use  Smoking Status Former Smoker  Smokeless Tobacco Never Used   ROS: Per HPI   Objective:    BP 134/83   Pulse 78   Temp (!) 96.6 F (35.9 C) (Oral)   Ht 6\' 2"  (1.88 m)   Wt 219 lb (99.3 kg)   BMI 28.12 kg/m   Wt Readings from Last 3 Encounters:  08/26/17 219 lb (99.3 kg)  12/28/16 216 lb 9.6 oz (98.2 kg)  06/12/16 222 lb 3.2 oz (100.8 kg)    Gen: NAD, alert, cooperative with exam, NCAT EYES: EOMI, no conjunctival injection, or no icterus ENT: OP without erythema LYMPH: no cervical LAD CV: NRRR, normal S1/S2, no murmur, distal pulses 2+ b/l Resp: CTABL, no wheezes, normal WOB Abd: +BS, soft, NTND.  No CVA tenderness Ext: No edema, warm Neuro: Alert and oriented  Assessment & Plan:  [name] was seen today for medical management of chronic issues.  Diagnoses and all orders for this visit:  Type 2 diabetes mellitus with diabetic polyneuropathy, without long-term current use of insulin (HCC) A1c 5.5.  Continue metformin, no side effects.  Kidney function stable. continue to avoid sugary foods -     metFORMIN (GLUCOPHAGE) 500 MG tablet; Take 1 tablet (500 mg total) by mouth 2 (two) times daily with a  meal. -     Microalbumin / creatinine urine ratio -     Bayer DCA Hb A1c Waived  Essential hypertension Adequate control, continue current medicines -     amLODipine (NORVASC) 5 MG tablet; Take 1 tablet (5 mg total) by mouth daily. -     benazepril (LOTENSIN) 40 MG tablet; Take 1 tablet (40 mg total) by mouth daily.  Hypothyroidism, unspecified type Stable, continue below -     levothyroxine (SYNTHROID, LEVOTHROID) 100 MCG tablet; TAKE 1 TABLET EVERY DAY BEFORE BREAKFAST -     TSH  Hyperlipidemia associated with type 2 diabetes mellitus (HCC) Stable, continue below -     pravastatin (PRAVACHOL) 40 MG tablet; Take 1 tablet (40 mg total) by mouth daily.  Dysuria Symptoms mostly improved, will go ahead and check below.  Treat if ongoing symptoms when the urine culture returns. -     Urinalysis, Complete -     Urine Culture  Other orders -     Microscopic Examination   Follow up plan: Return in about 6 months (around 02/26/2018). Assunta Found, MD [place]

## 2017-08-27 LAB — MICROALBUMIN / CREATININE URINE RATIO
Creatinine, Urine: 73.9 mg/dL
Microalb/Creat Ratio: 41.9 mg/g creat — ABNORMAL HIGH (ref 0.0–30.0)
Microalbumin, Urine: 31 ug/mL

## 2017-08-27 LAB — TSH: TSH: 1.71 u[IU]/mL (ref 0.450–4.500)

## 2017-08-29 ENCOUNTER — Other Ambulatory Visit: Payer: Self-pay | Admitting: Pediatrics

## 2017-08-29 ENCOUNTER — Other Ambulatory Visit: Payer: Self-pay | Admitting: *Deleted

## 2017-08-29 DIAGNOSIS — N390 Urinary tract infection, site not specified: Secondary | ICD-10-CM

## 2017-08-29 LAB — URINE CULTURE

## 2017-08-29 MED ORDER — DOXYCYCLINE HYCLATE 100 MG PO TABS: 100.0000 mg | ORAL_TABLET | Freq: Two times a day (BID) | ORAL | 0 refills | Status: DC

## 2017-08-29 MED ORDER — DOXYCYCLINE HYCLATE 100 MG PO TABS
100.0000 mg | ORAL_TABLET | Freq: Two times a day (BID) | ORAL | 0 refills | Status: DC
Start: 2017-08-29 — End: 2017-08-29

## 2017-09-05 DIAGNOSIS — E119 Type 2 diabetes mellitus without complications: Secondary | ICD-10-CM | POA: Diagnosis not present

## 2017-09-05 DIAGNOSIS — K5010 Crohn's disease of large intestine without complications: Secondary | ICD-10-CM | POA: Diagnosis not present

## 2017-09-05 DIAGNOSIS — K921 Melena: Secondary | ICD-10-CM | POA: Diagnosis not present

## 2017-09-05 DIAGNOSIS — E872 Acidosis: Secondary | ICD-10-CM | POA: Diagnosis not present

## 2017-09-05 DIAGNOSIS — R1032 Left lower quadrant pain: Secondary | ICD-10-CM | POA: Diagnosis not present

## 2017-09-05 DIAGNOSIS — K559 Vascular disorder of intestine, unspecified: Secondary | ICD-10-CM | POA: Diagnosis not present

## 2017-09-05 DIAGNOSIS — K922 Gastrointestinal hemorrhage, unspecified: Secondary | ICD-10-CM | POA: Diagnosis not present

## 2017-09-05 DIAGNOSIS — I77810 Thoracic aortic ectasia: Secondary | ICD-10-CM | POA: Diagnosis not present

## 2017-09-05 DIAGNOSIS — Z7984 Long term (current) use of oral hypoglycemic drugs: Secondary | ICD-10-CM | POA: Diagnosis not present

## 2017-09-05 DIAGNOSIS — K529 Noninfective gastroenteritis and colitis, unspecified: Secondary | ICD-10-CM | POA: Diagnosis not present

## 2017-09-05 DIAGNOSIS — I771 Stricture of artery: Secondary | ICD-10-CM | POA: Diagnosis not present

## 2017-09-05 DIAGNOSIS — I774 Celiac artery compression syndrome: Secondary | ICD-10-CM | POA: Diagnosis not present

## 2017-09-05 DIAGNOSIS — K8020 Calculus of gallbladder without cholecystitis without obstruction: Secondary | ICD-10-CM | POA: Diagnosis not present

## 2017-09-05 DIAGNOSIS — I10 Essential (primary) hypertension: Secondary | ICD-10-CM | POA: Diagnosis not present

## 2017-09-05 DIAGNOSIS — E785 Hyperlipidemia, unspecified: Secondary | ICD-10-CM | POA: Diagnosis not present

## 2017-09-05 DIAGNOSIS — E039 Hypothyroidism, unspecified: Secondary | ICD-10-CM | POA: Diagnosis not present

## 2017-09-06 DIAGNOSIS — K559 Vascular disorder of intestine, unspecified: Secondary | ICD-10-CM | POA: Insufficient documentation

## 2017-09-09 DIAGNOSIS — K551 Chronic vascular disorders of intestine: Secondary | ICD-10-CM | POA: Insufficient documentation

## 2017-09-09 DIAGNOSIS — I774 Celiac artery compression syndrome: Secondary | ICD-10-CM | POA: Insufficient documentation

## 2017-09-09 DIAGNOSIS — I771 Stricture of artery: Secondary | ICD-10-CM

## 2017-09-18 LAB — HM DIABETES EYE EXAM

## 2017-09-19 ENCOUNTER — Encounter: Payer: Self-pay | Admitting: *Deleted

## 2017-09-19 ENCOUNTER — Ambulatory Visit (INDEPENDENT_AMBULATORY_CARE_PROVIDER_SITE_OTHER): Payer: Medicare HMO | Admitting: *Deleted

## 2017-09-19 VITALS — BP 150/82 | HR 74 | Ht 71.5 in | Wt 218.0 lb

## 2017-09-19 DIAGNOSIS — Z0000 Encounter for general adult medical examination without abnormal findings: Secondary | ICD-10-CM | POA: Diagnosis not present

## 2017-09-19 DIAGNOSIS — Z23 Encounter for immunization: Secondary | ICD-10-CM | POA: Diagnosis not present

## 2017-09-19 NOTE — Progress Notes (Addendum)
Subjective:   [name] is a 84 y.o. male who presents for a Medicare Annual Wellness Visit. [name] lives at home with his one of his daughters. His wife passed in 1976. He has two adult daughters and they eat at her home on Saturdays. He is able to care for himself and maintains the yardwork at his home.    Review of Systems    Patient reports that his overall health is better compared to last year.  Cardiac Risk Factors include: advanced age (>51men, >65 women);diabetes mellitus;dyslipidemia;male gender;hypertension;sedentary lifestyle  GI: ischemic colitis and GI bleed 08/2016. No problems now  All other systems negative       Current Medications (verified) Outpatient Encounter Medications as of 09/19/2017  Medication Sig  . amLODipine (NORVASC) 5 MG tablet Take 1 tablet (5 mg total) by mouth daily.  . benazepril (LOTENSIN) 40 MG tablet Take 1 tablet (40 mg total) by mouth daily.  Marland Kitchen glucose blood (ACCU-CHEK AVIVA PLUS) test strip Test blood glucose twice daily  . levothyroxine (SYNTHROID, LEVOTHROID) 100 MCG tablet TAKE 1 TABLET EVERY DAY BEFORE BREAKFAST  . metFORMIN (GLUCOPHAGE) 500 MG tablet Take 1 tablet (500 mg total) by mouth 2 (two) times daily with a meal.  . pravastatin (PRAVACHOL) 40 MG tablet Take 1 tablet (40 mg total) by mouth daily.  Marland Kitchen doxycycline (VIBRA-TABS) 100 MG tablet Take 1 tablet (100 mg total) by mouth 2 (two) times daily.   No facility-administered encounter medications on file as of 09/19/2017.     Allergies (verified) Penicillins   History: Past Medical History:  Diagnosis Date  . Blepharitis   . COPD (chronic obstructive pulmonary disease) ([place])   . Diabetes mellitus without complication ([place])   . Gout   . Hearing impaired   . Hyperlipidemia   . Hypertension   . Thyroid disease    Past Surgical History:  Procedure Laterality Date  . ABDOMINAL AORTIC ANEURYSM REPAIR    . CATARACT EXTRACTION Left   . JOINT REPLACEMENT     Family  History  Problem Relation Age of Onset  . Stroke Mother   . Heart disease Father   . Healthy Daughter   . Healthy Daughter    Social History   Socioeconomic History  . Marital status: Widowed    Spouse name: Not on file  . Number of children: 2  . Years of education: 6  . Highest education level: 6th grade  Occupational History  . Occupation: retired    Comment: Copy  . Financial resource strain: Not hard at all  . Food insecurity:    Worry: Never true    Inability: Never true  . Transportation needs:    Medical: No    Non-medical: No  Tobacco Use  . Smoking status: Former Research scientist (life sciences)  . Smokeless tobacco: Never Used  Substance and Sexual Activity  . Alcohol use: No  . Drug use: No  . Sexual activity: Not Currently  Lifestyle  . Physical activity:    Days per week: 7 days    Minutes per session: 20 min  . Stress: Not at all  Relationships  . Social connections:    Talks on phone: More than three times a week    Gets together: More than three times a week    Attends religious service: More than 4 times per year    Active member of club or organization: Yes    Attends meetings of clubs or organizations:  More than 4 times per year    Relationship status: Widowed  Other Topics Concern  . Not on file  Social History Narrative  . Not on file    Tobacco Use No.  Clinical Intake:     Pain : No/denies pain     Nutritional Status: BMI 25 -29 Overweight Diabetes: No  How often do you need to have someone help you when you read instructions, pamphlets, or other written materials from your doctor or pharmacy?: 5 - Always What is the last grade level you completed in school?: 6  Interpreter Needed?: No     Activities of Daily Living In your present state of health, do you have any difficulty performing the following activities: 09/19/2017  Hearing? Y  Vision? N  Difficulty concentrating or making decisions? N  Walking or climbing stairs? N    Dressing or bathing? N  Doing errands, shopping? N  Preparing Food and eating ? N  Using the Toilet? N  In the past six months, have you accidently leaked urine? N  Do you have problems with loss of bowel control? N  Managing your Medications? N  Managing your Finances? N  Housekeeping or managing your Housekeeping? N  Some recent data might be hidden     Diet 2 meals and snacks Drinks mostly diet Pepsi and some water  Exercise Current Exercise Habits: The patient does not participate in regular exercise at present, Exercise limited by: Other - see comments(advanced age)    Depression Screen PHQ 2/9 Scores 09/19/2017 08/26/2017 12/28/2016 06/12/2016  PHQ - 2 Score 0 0 0 0     Fall Risk Fall Risk  09/19/2017 08/26/2017 12/28/2016 06/12/2016 06/04/2016  Falls in the past year? No No Yes No No  Number falls in past yr: - - 1 - -  Injury with Fall? - - No - -  Risk for fall due to : - - History of fall(s);Impaired balance/gait - -    Safety Is the patient's home free of loose throw rugs in walkways, pet beds, electrical cords, etc?   yes      Grab bars in the bathroom? yes      Walkin shower? yes      Shower Seat? yes      Handrails on the stairs?   yes      Adequate lighting?   yes  Has a handicapped ramp that he uses instead of stairs.   Patient Care Team: Eustaquio Maize, MD as PCP - General (Pediatrics)  Hospitalizations, surgeries, and ER visits in previous 12 months No hospitalizations, ER visits, or surgeries this past year.  Objective:    Today's Vitals   09/19/17 1404 09/19/17 1443  BP: (!) 164/86 (!) 150/82  Pulse: 76 74  Weight: 218 lb (98.9 kg)   Height: 5' 11.5" (1.816 m)    Body mass index is 29.98 kg/m.  Advanced Directives 09/19/2017  Does Patient Have a Medical Advance Directive? Yes  Type of Advance Directive [place]  Does patient want to make changes to medical advance directive? No - Patient declined  Copy of [place] in Chart? No - copy requested  Would patient like information on creating a medical advance directive? No - Patient declined    Hearing/Vision  No hearing or vision deficits noted during visit. Eye exam yesterday at [facility]  Cognitive Function: MMSE - [place] Exam 09/19/2017  Not completed: Unable to complete  Orientation to time 5  Orientation to Place 4  Registration 3  Attention/ Calculation 0  Attention/Calculation-comments unable to spell  Recall 2  Language- name 2 objects 2  Language- repeat 0  Language- repeat-comments hearing loss-unable to understand well enough to repeat  Language- follow 3 step command 3  Language- read & follow direction 0  Language-read & follow direction-comments unable to read  Write a sentence 0  Write a sentence-comments unable to read and write  Copy design 1  Total score 20       Normal Cognitive Function Screening: Likely normal. Low score due to illiteracy. No cognitive deficits noted during visit.     Immunizations and Health Maintenance Immunization History  Administered Date(s) Administered  . Influenza, High Dose Seasonal PF 11/30/2014, 12/13/2015, 12/28/2016  . Influenza,inj,Quad PF,6+ Mos 12/02/2012, 11/23/2013  . Pneumococcal Conjugate-13 06/04/2016  . Pneumococcal Polysaccharide-23 09/19/2017  . Td 06/12/2016   Health Maintenance Due  Topic Date Due  . OPHTHALMOLOGY EXAM  02/18/2016  . FOOT EXAM  06/04/2017  . INFLUENZA VACCINE  08/29/2017   Health Maintenance  Topic Date Due  . OPHTHALMOLOGY EXAM  02/18/2016  . FOOT EXAM  06/04/2017  . INFLUENZA VACCINE  08/29/2017  . HEMOGLOBIN A1C  02/26/2018  . TETANUS/TDAP  06/13/2026  . PNA vac Low Risk Adult  Completed        Assessment:   This is a routine wellness examination for [name].      Plan:    Goals    . DIET - DECREASE SODA OR JUICE INTAKE    . DIET - INCREASE WATER INTAKE        Health Maintenance  Recommendations: diabetic foot exam  pneumovax  Additional Screening Recommendations: Lung: Low Dose CT Chest recommended if Age 52-80 years, 30 pack-year currently smoking OR have quit w/in 15years. Patient does not qualify. Hepatitis C Screening recommended: no  Today's Orders Orders Placed This Encounter  Procedures  . Pneumococcal polysaccharide vaccine 23-valent greater than or equal to 2yo subcutaneous/IM  Administered today  Keep f/u with Eustaquio Maize, MD and any other specialty appointments you may have Continue current medications Move carefully to avoid falls. Use assistive devices like a cane or walker if needed. Aim for at least 150 minutes of moderate activity a week. This can be chair exercises if necessary. Reading or puzzles are a good way to exercise your brain Stay connected with friends and family. Social connections are beneficial to your emotional and mental health.  Appointment scheduled with nurse to rck blood pressure in one week  I have personally reviewed and noted the following in the patient's chart:   . Medical and social history . Use of alcohol, tobacco or illicit drugs  . Current medications and supplements . Functional ability and status . Nutritional status . Physical activity . Advanced directives . List of other physicians . Hospitalizations, surgeries, and ER visits in previous 12 months . Vitals . Screenings to include cognitive, depression, and falls . Referrals and appointments  In addition, I have reviewed and discussed with patient certain preventive protocols, quality metrics, and best practice recommendations. A written personalized care plan for preventive services as well as general preventive health recommendations were provided to patient.     Chong Sicilian, RN   09/19/2017

## 2017-09-19 NOTE — Patient Instructions (Signed)
[name] , Thank you for taking time to come for your Medicare Wellness Visit. I appreciate your ongoing commitment to your health goals. Please review the following plan we discussed and let me know if I can assist you in the future.   These are the goals we discussed: Goals    . DIET - DECREASE SODA OR JUICE INTAKE    . DIET - INCREASE WATER INTAKE       This is a list of the screening recommended for you and due dates:  Health Maintenance  Topic Date Due  . Eye exam for diabetics  02/18/2016  . Complete foot exam   06/04/2017  . Pneumonia vaccines (2 of 2 - PPSV23) 06/04/2017  . Flu Shot  08/29/2017  . Hemoglobin A1C  02/26/2018  . Tetanus Vaccine  06/13/2026    Pneumococcal Polysaccharide Vaccine: What You Need to Know 1. Why get vaccinated? Vaccination can protect older adults (and some children and younger adults) from pneumococcal disease. Pneumococcal disease is caused by bacteria that can spread from person to person through close contact. It can cause ear infections, and it can also lead to more serious infections of the:  Lungs (pneumonia),  Blood (bacteremia), and  Covering of the brain and spinal cord (meningitis). Meningitis can cause deafness and brain damage, and it can be fatal.  Anyone can get pneumococcal disease, but children under 45 years of age, people with certain medical conditions, adults over 44 years of age, and cigarette smokers are at the highest risk. About 18,000 older adults die each year from pneumococcal disease in the [place]. Treatment of pneumococcal infections with penicillin and other drugs used to be more effective. But some strains of the disease have become resistant to these drugs. This makes prevention of the disease, through vaccination, even more important. 2. Pneumococcal polysaccharide vaccine (PPSV23) Pneumococcal polysaccharide vaccine (PPSV23) protects against 23 types of pneumococcal bacteria. It will not prevent all  pneumococcal disease. PPSV23 is recommended for:  All adults 45 years of age and older,  Anyone 2 through 82 years of age with certain long-term health problems,  Anyone 2 through 82 years of age with a weakened immune system,  Adults 2 through 82 years of age who smoke cigarettes or have asthma.  Most people need only one dose of PPSV. A second dose is recommended for certain high-risk groups. People 73 and older should get a dose even if they have gotten one or more doses of the vaccine before they turned 65. Your healthcare provider can give you more information about these recommendations. Most healthy adults develop protection within 2 to 3 weeks of getting the shot. 3. Some people should not get this vaccine  Anyone who has had a life-threatening allergic reaction to PPSV should not get another dose.  Anyone who has a severe allergy to any component of PPSV should not receive it. Tell your provider if you have any severe allergies.  Anyone who is moderately or severely ill when the shot is scheduled may be asked to wait until they recover before getting the vaccine. Someone with a mild illness can usually be vaccinated.  Children less than 6 years of age should not receive this vaccine.  There is no evidence that PPSV is harmful to either a pregnant woman or to her fetus. However, as a precaution, women who need the vaccine should be vaccinated before becoming pregnant, if possible. 4. Risks of a vaccine reaction With any medicine, including vaccines,  there is a chance of side effects. These are usually mild and go away on their own, but serious reactions are also possible. About half of people who get PPSV have mild side effects, such as redness or pain where the shot is given, which go away within about two days. Less than 1 out of 100 people develop a fever, muscle aches, or more severe local reactions. Problems that could happen after any vaccine:  People sometimes faint  after a medical procedure, including vaccination. Sitting or lying down for about 15 minutes can help prevent fainting, and injuries caused by a fall. Tell your doctor if you feel dizzy, or have vision changes or ringing in the ears.  Some people get severe pain in the shoulder and have difficulty moving the arm where a shot was given. This happens very rarely.  Any medication can cause a severe allergic reaction. Such reactions from a vaccine are very rare, estimated at about 1 in a million doses, and would happen within a few minutes to a few hours after the vaccination. As with any medicine, there is a very remote chance of a vaccine causing a serious injury or death. The safety of vaccines is always being monitored. For more information, visit: http://www.aguilar.org/ 5. What if there is a serious reaction? What should I look for? Look for anything that concerns you, such as signs of a severe allergic reaction, very high fever, or unusual behavior. Signs of a severe allergic reaction can include hives, swelling of the face and throat, difficulty breathing, a fast heartbeat, dizziness, and weakness. These would usually start a few minutes to a few hours after the vaccination. What should I do? If you think it is a severe allergic reaction or other emergency that can't wait, call 9-1-1 or get to the nearest hospital. Otherwise, call your doctor. Afterward, the reaction should be reported to the Vaccine Adverse Event Reporting System (VAERS). Your doctor might file this report, or you can do it yourself through the VAERS web site at www.vaers.SamedayNews.es, or by calling 240-027-5307. VAERS does not give medical advice. 6. How can I learn more?  Ask your doctor. He or she can give you the vaccine package insert or suggest other sources of information.  Call your local or state health department.  Contact the Centers for Disease Control and Prevention (CDC): ? Call (618)697-0685  (1-800-CDC-INFO) or ? Visit CDC's website at http://hunter.com/ CDC Pneumococcal Polysaccharide Vaccine VIS (05/22/13) This information is not intended to replace advice given to you by your health care provider. Make sure you discuss any questions you have with your health care provider. Document Released: 11/12/2005 Document Revised: 10/06/2015 Document Reviewed: 10/06/2015 Elsevier Interactive Patient Education  2017 [employer].

## 2017-09-25 ENCOUNTER — Encounter: Payer: Self-pay | Admitting: *Deleted

## 2017-09-25 ENCOUNTER — Ambulatory Visit: Payer: Medicare HMO | Admitting: *Deleted

## 2017-09-25 VITALS — BP 154/84 | HR 84

## 2017-09-25 DIAGNOSIS — Z0130 Encounter for examination of blood pressure without abnormal findings: Secondary | ICD-10-CM

## 2017-09-25 NOTE — Progress Notes (Signed)
Pt here for BP check BP 154 84 P 84  Rck BP  145 75 P 77

## 2017-10-25 DIAGNOSIS — I771 Stricture of artery: Secondary | ICD-10-CM | POA: Diagnosis not present

## 2017-11-01 ENCOUNTER — Ambulatory Visit: Payer: Medicare HMO | Admitting: *Deleted

## 2017-11-07 DIAGNOSIS — I771 Stricture of artery: Secondary | ICD-10-CM | POA: Diagnosis not present

## 2017-11-07 DIAGNOSIS — I774 Celiac artery compression syndrome: Secondary | ICD-10-CM | POA: Diagnosis not present

## 2017-11-13 ENCOUNTER — Other Ambulatory Visit: Payer: Self-pay | Admitting: Pediatrics

## 2017-11-13 DIAGNOSIS — I10 Essential (primary) hypertension: Secondary | ICD-10-CM

## 2017-11-29 ENCOUNTER — Ambulatory Visit (INDEPENDENT_AMBULATORY_CARE_PROVIDER_SITE_OTHER): Payer: Medicare HMO | Admitting: *Deleted

## 2017-11-29 DIAGNOSIS — Z23 Encounter for immunization: Secondary | ICD-10-CM | POA: Diagnosis not present

## 2018-02-07 ENCOUNTER — Ambulatory Visit: Payer: Medicare HMO | Admitting: Pediatrics

## 2018-02-26 ENCOUNTER — Ambulatory Visit: Payer: Medicare HMO | Admitting: Pediatrics

## 2018-03-06 ENCOUNTER — Ambulatory Visit (INDEPENDENT_AMBULATORY_CARE_PROVIDER_SITE_OTHER): Payer: Medicare HMO | Admitting: Family

## 2018-03-06 ENCOUNTER — Encounter: Payer: Self-pay | Admitting: Family

## 2018-03-06 VITALS — BP 153/84 | HR 75 | Temp 96.7°F | Ht 71.5 in | Wt 216.2 lb

## 2018-03-06 DIAGNOSIS — E039 Hypothyroidism, unspecified: Secondary | ICD-10-CM

## 2018-03-06 DIAGNOSIS — E785 Hyperlipidemia, unspecified: Secondary | ICD-10-CM

## 2018-03-06 DIAGNOSIS — E663 Overweight: Secondary | ICD-10-CM

## 2018-03-06 DIAGNOSIS — E1142 Type 2 diabetes mellitus with diabetic polyneuropathy: Secondary | ICD-10-CM

## 2018-03-06 DIAGNOSIS — E1169 Type 2 diabetes mellitus with other specified complication: Secondary | ICD-10-CM | POA: Diagnosis not present

## 2018-03-06 DIAGNOSIS — J449 Chronic obstructive pulmonary disease, unspecified: Secondary | ICD-10-CM

## 2018-03-06 DIAGNOSIS — E669 Obesity, unspecified: Secondary | ICD-10-CM | POA: Insufficient documentation

## 2018-03-06 DIAGNOSIS — I10 Essential (primary) hypertension: Secondary | ICD-10-CM

## 2018-03-06 LAB — BAYER DCA HB A1C WAIVED: HB A1C (BAYER DCA - WAIVED): 5.5 % (ref <7.0)

## 2018-03-06 MED ORDER — PRAVASTATIN SODIUM 40 MG PO TABS: 40.0000 mg | ORAL_TABLET | Freq: Every day | ORAL | 1 refills | Status: DC

## 2018-03-06 MED ORDER — BENAZEPRIL HCL 40 MG PO TABS: 40.0000 mg | ORAL_TABLET | Freq: Every day | ORAL | 3 refills | Status: DC

## 2018-03-06 MED ORDER — LEVOTHYROXINE SODIUM 100 MCG PO TABS: ORAL_TABLET | ORAL | 1 refills | Status: DC

## 2018-03-06 MED ORDER — AMLODIPINE BESYLATE 5 MG PO TABS: 5.0000 mg | ORAL_TABLET | Freq: Every day | ORAL | 3 refills | Status: DC

## 2018-03-06 MED ORDER — METFORMIN HCL 500 MG PO TABS: 500.0000 mg | ORAL_TABLET | Freq: Two times a day (BID) | ORAL | 1 refills | Status: DC

## 2018-03-06 NOTE — Progress Notes (Signed)
Subjective:    Patient ID: [name], male    DOB: 05/18/1924, 85 y.o.   MRN: [id number]  Chief Complaint  Patient presents with  . Medical Management of Chronic Issues   PT presents to the office today for chronic follow up.  Hypertension  This is a chronic problem. The current episode started more than 1 year ago. The problem has been waxing and waning since onset. The problem is uncontrolled. Associated symptoms include malaise/fatigue and shortness of breath. Pertinent negatives include no blurred vision or peripheral edema. Risk factors for coronary artery disease include dyslipidemia, obesity, male gender and sedentary lifestyle. The current treatment provides moderate improvement. There is no history of kidney disease, CAD/MI, CVA or heart failure. Identifiable causes of hypertension include a thyroid problem.  Diabetes  He presents for his follow-up diabetic visit. He has type 2 diabetes mellitus. His disease course has been stable. There are no hypoglycemic associated symptoms. Pertinent negatives for diabetes include no blurred vision, no foot paresthesias and no visual change. Symptoms are stable. Pertinent negatives for diabetic complications include no CVA. Risk factors for coronary artery disease include diabetes mellitus, dyslipidemia, male sex, hypertension and sedentary lifestyle. (Does not check blood glucose at home ) Eye exam is current.  Thyroid Problem  Presents for follow-up visit. Symptoms include dry skin. Patient reports no constipation, depressed mood or visual change. The symptoms have been stable. His past medical history is significant for hyperlipidemia. There is no history of heart failure.  Hyperlipidemia  This is a chronic problem. The current episode started more than 1 year ago. The problem is controlled. Recent lipid tests were reviewed and are normal. Associated symptoms include shortness of breath. Current antihyperlipidemic treatment includes statins. The  current treatment provides moderate improvement of lipids. Risk factors for coronary artery disease include dyslipidemia, diabetes mellitus, male sex, hypertension and a sedentary lifestyle.  COPD PT quit smoking 36 years ago. SOB with exertion.     Review of Systems  Constitutional: Positive for malaise/fatigue.  Eyes: Negative for blurred vision.  Respiratory: Positive for shortness of breath.   Gastrointestinal: Negative for constipation.  All other systems reviewed and are negative.      Objective:   Physical Exam Vitals signs reviewed.  Constitutional:      General: He is not in acute distress.    Appearance: He is well-developed.  HENT:     Head: Normocephalic.     Right Ear: Tympanic membrane normal.     Left Ear: Tympanic membrane normal.  Eyes:     General:        Right eye: No discharge.        Left eye: No discharge.     Pupils: Pupils are equal, round, and reactive to light.  Neck:     Musculoskeletal: Normal range of motion and neck supple.     Thyroid: No thyromegaly.  Cardiovascular:     Rate and Rhythm: Normal rate and regular rhythm.     Heart sounds: Normal heart sounds. No murmur.  Pulmonary:     Effort: Pulmonary effort is normal. No respiratory distress.     Breath sounds: Decreased breath sounds present. No wheezing.  Abdominal:     General: Bowel sounds are normal. There is no distension.     Palpations: Abdomen is soft.     Tenderness: There is no abdominal tenderness.  Musculoskeletal:        General: No tenderness.     Comments: Generalized weakness  Skin:    General: Skin is warm and dry.     Findings: No erythema or rash.  Neurological:     Mental Status: He is alert and oriented to person, place, and time.     Cranial Nerves: No cranial nerve deficit.     Deep Tendon Reflexes: Reflexes are normal and symmetric.  Psychiatric:        Behavior: Behavior normal.        Thought Content: Thought content normal.        Judgment: Judgment  normal.       BP (!) 153/84   Pulse 75   Temp (!) 96.7 F (35.9 C) (Oral)   Ht 5' 11.5" (1.816 m)   Wt 216 lb 3.2 oz (98.1 kg)   BMI 29.73 kg/m      Assessment & Plan:  [name] comes in today with chief complaint of Medical Management of Chronic Issues   Diagnosis and orders addressed:  1. Essential hypertension - CMP14+EGFR - CBC with Differential/Platelet - amLODipine (NORVASC) 5 MG tablet; Take 1 tablet (5 mg total) by mouth daily.  Dispense: 90 tablet; Refill: 3 - benazepril (LOTENSIN) 40 MG tablet; Take 1 tablet (40 mg total) by mouth daily.  Dispense: 90 tablet; Refill: 3  2. Chronic obstructive pulmonary disease, unspecified COPD type ([place]) - CMP14+EGFR - CBC with Differential/Platelet  3. Type 2 diabetes mellitus with other specified complication, without long-term current use of insulin (HCC) - CMP14+EGFR - CBC with Differential/Platelet - Bayer DCA Hb A1c Waived  4. Hypothyroidism, unspecified type - CMP14+EGFR - CBC with Differential/Platelet - TSH - levothyroxine (SYNTHROID, LEVOTHROID) 100 MCG tablet; TAKE 1 TABLET EVERY DAY BEFORE BREAKFAST  Dispense: 90 tablet; Refill: 1  5. Hyperlipidemia associated with type 2 diabetes mellitus (HCC) - CMP14+EGFR - CBC with Differential/Platelet - Lipid panel - pravastatin (PRAVACHOL) 40 MG tablet; Take 1 tablet (40 mg total) by mouth daily.  Dispense: 90 tablet; Refill: 1  6. Overweight (BMI 25.0-29.9)  7. Type 2 diabetes mellitus with diabetic polyneuropathy, without long-term current use of insulin (HCC) - metFORMIN (GLUCOPHAGE) 500 MG tablet; Take 1 tablet (500 mg total) by mouth 2 (two) times daily with a meal.  Dispense: 180 tablet; Refill: 1   Labs pending Health Maintenance reviewed Diet and exercise encouraged  Follow up plan: 6 months    Evelina Dun, FNP

## 2018-03-06 NOTE — Patient Instructions (Signed)
Health Maintenance After Age 83 After age 83, you are at a higher risk for certain long-term diseases and infections as well as injuries from falls. Falls are a major cause of broken bones and head injuries in people who are older than age 83. Getting regular preventive care can help to keep you healthy and well. Preventive care includes getting regular testing and making lifestyle changes as recommended by your health care provider. Talk with your health care provider about:  Which screenings and tests you should have. A screening is a test that checks for a disease when you have no symptoms.  A diet and exercise plan that is right for you. What should I know about screenings and tests to prevent falls? Screening and testing are the best ways to find a health problem early. Early diagnosis and treatment give you the best chance of managing medical conditions that are common after age 83. Certain conditions and lifestyle choices may make you more likely to have a fall. Your health care provider may recommend:  Regular vision checks. Poor vision and conditions such as cataracts can make you more likely to have a fall. If you wear glasses, make sure to get your prescription updated if your vision changes.  Medicine review. Work with your health care provider to regularly review all of the medicines you are taking, including over-the-counter medicines. Ask your health care provider about any side effects that may make you more likely to have a fall. Tell your health care provider if any medicines that you take make you feel dizzy or sleepy.  Osteoporosis screening. Osteoporosis is a condition that causes the bones to get weaker. This can make the bones weak and cause them to break more easily.  Blood pressure screening. Blood pressure changes and medicines to control blood pressure can make you feel dizzy.  Strength and balance checks. Your health care provider may recommend certain tests to check your  strength and balance while standing, walking, or changing positions.  Foot health exam. Foot pain and numbness, as well as not wearing proper footwear, can make you more likely to have a fall.  Depression screening. You may be more likely to have a fall if you have a fear of falling, feel emotionally low, or feel unable to do activities that you used to do.  Alcohol use screening. Using too much alcohol can affect your balance and may make you more likely to have a fall. What actions can I take to lower my risk of falls? General instructions  Talk with your health care provider about your risks for falling. Tell your health care provider if: ? You fall. Be sure to tell your health care provider about all falls, even ones that seem minor. ? You feel dizzy, sleepy, or off-balance.  Take over-the-counter and prescription medicines only as told by your health care provider. These include any supplements.  Eat a healthy diet and maintain a healthy weight. A healthy diet includes low-fat dairy products, low-fat (lean) meats, and fiber from whole grains, beans, and lots of fruits and vegetables. Home safety  Remove any tripping hazards, such as rugs, cords, and clutter.  Install safety equipment such as grab bars in bathrooms and safety rails on stairs.  Keep rooms and walkways well-lit. Activity   Follow a regular exercise program to stay fit. This will help you maintain your balance. Ask your health care provider what types of exercise are appropriate for you.  If you need a cane or   walker, use it as recommended by your health care provider.  Wear supportive shoes that have nonskid soles. Lifestyle  Do not drink alcohol if your health care provider tells you not to drink.  If you drink alcohol, limit how much you have: ? 0-1 drink a day for women. ? 0-2 drinks a day for men.  Be aware of how much alcohol is in your drink. In the [place] one drink equals one typical bottle of beer (12  oz), one-half glass of wine (5 oz), or one shot of hard liquor (1 oz).  Do not use any products that contain nicotine or tobacco, such as cigarettes and e-cigarettes. If you need help quitting, ask your health care provider. Summary  Having a healthy lifestyle and getting preventive care can help to protect your health and wellness after age 83.  Screening and testing are the best way to find a health problem early and help you avoid having a fall. Early diagnosis and treatment give you the best chance for managing medical conditions that are more common for people who are older than age 83.  Falls are a major cause of broken bones and head injuries in people who are older than age 83. Take precautions to prevent a fall at home.  Work with your health care provider to learn what changes you can make to improve your health and wellness and to prevent falls. This information is not intended to replace advice given to you by your health care provider. Make sure you discuss any questions you have with your health care provider. Document Released: 11/28/2016 Document Revised: 11/28/2016 Document Reviewed: 11/28/2016 Elsevier Interactive Patient Education  2019 [employer] Inc.  

## 2018-03-07 LAB — CBC WITH DIFFERENTIAL/PLATELET
Basophils Absolute: 0 10*3/uL (ref 0.0–0.2)
Basos: 0 %
EOS (ABSOLUTE): 0.1 10*3/uL (ref 0.0–0.4)
Eos: 2 %
Hematocrit: 37.7 % (ref 37.5–51.0)
Hemoglobin: 13 g/dL (ref 13.0–17.7)
IMMATURE GRANS (ABS): 0 10*3/uL (ref 0.0–0.1)
Immature Granulocytes: 0 %
LYMPHS: 39 %
Lymphocytes Absolute: 1.6 10*3/uL (ref 0.7–3.1)
MCH: 33.7 pg — AB (ref 26.6–33.0)
MCHC: 34.5 g/dL (ref 31.5–35.7)
MCV: 98 fL — ABNORMAL HIGH (ref 79–97)
Monocytes Absolute: 0.3 10*3/uL (ref 0.1–0.9)
Monocytes: 8 %
NEUTROS ABS: 2.1 10*3/uL (ref 1.4–7.0)
NEUTROS PCT: 51 %
Platelets: 102 10*3/uL — ABNORMAL LOW (ref 150–450)
RBC: 3.86 x10E6/uL — ABNORMAL LOW (ref 4.14–5.80)
RDW: 13.3 % (ref 11.6–15.4)
WBC: 4.2 10*3/uL (ref 3.4–10.8)

## 2018-03-07 LAB — LIPID PANEL
Chol/HDL Ratio: 2.8 ratio (ref 0.0–5.0)
Cholesterol, Total: 139 mg/dL (ref 100–199)
HDL: 49 mg/dL (ref >39)
LDL Calculated: 74 mg/dL (ref 0–99)
Triglycerides: 81 mg/dL (ref 0–149)
VLDL CHOLESTEROL CAL: 16 mg/dL (ref 5–40)

## 2018-03-07 LAB — CMP14+EGFR
A/G RATIO: 1.8 (ref 1.2–2.2)
ALT: 6 IU/L (ref 0–44)
AST: 12 IU/L (ref 0–40)
Albumin: 4.4 g/dL (ref 3.5–4.6)
Alkaline Phosphatase: 58 IU/L (ref 39–117)
BILIRUBIN TOTAL: 0.6 mg/dL (ref 0.0–1.2)
BUN/Creatinine Ratio: 20 (ref 10–24)
BUN: 18 mg/dL (ref 10–36)
CO2: 22 mmol/L (ref 20–29)
Calcium: 9.4 mg/dL (ref 8.6–10.2)
Chloride: 99 mmol/L (ref 96–106)
Creatinine, Ser: 0.91 mg/dL (ref 0.76–1.27)
GFR calc non Af Amer: 73 mL/min/{1.73_m2} (ref >59)
GFR, EST AFRICAN AMERICAN: 85 mL/min/{1.73_m2} (ref >59)
Globulin, Total: 2.5 g/dL (ref 1.5–4.5)
Glucose: 102 mg/dL — ABNORMAL HIGH (ref 65–99)
POTASSIUM: 3.8 mmol/L (ref 3.5–5.2)
Sodium: 138 mmol/L (ref 134–144)
Total Protein: 6.9 g/dL (ref 6.0–8.5)

## 2018-03-07 LAB — TSH: TSH: 1.96 u[IU]/mL (ref 0.450–4.500)

## 2018-06-04 ENCOUNTER — Ambulatory Visit: Payer: Medicare HMO | Admitting: Family

## 2018-06-06 ENCOUNTER — Other Ambulatory Visit: Payer: Self-pay

## 2018-06-09 ENCOUNTER — Ambulatory Visit (INDEPENDENT_AMBULATORY_CARE_PROVIDER_SITE_OTHER): Payer: Medicare HMO | Admitting: Family

## 2018-06-09 ENCOUNTER — Other Ambulatory Visit: Payer: Self-pay

## 2018-06-09 ENCOUNTER — Encounter: Payer: Self-pay | Admitting: Family

## 2018-06-09 VITALS — BP 184/86 | HR 64 | Temp 96.8°F | Ht 71.5 in | Wt 219.2 lb

## 2018-06-09 DIAGNOSIS — I10 Essential (primary) hypertension: Secondary | ICD-10-CM

## 2018-06-09 DIAGNOSIS — E1169 Type 2 diabetes mellitus with other specified complication: Secondary | ICD-10-CM | POA: Diagnosis not present

## 2018-06-09 DIAGNOSIS — E039 Hypothyroidism, unspecified: Secondary | ICD-10-CM

## 2018-06-09 DIAGNOSIS — E785 Hyperlipidemia, unspecified: Secondary | ICD-10-CM

## 2018-06-09 DIAGNOSIS — J449 Chronic obstructive pulmonary disease, unspecified: Secondary | ICD-10-CM | POA: Diagnosis not present

## 2018-06-09 DIAGNOSIS — E669 Obesity, unspecified: Secondary | ICD-10-CM

## 2018-06-09 LAB — CMP14+EGFR
ALT: 9 IU/L (ref 0–44)
AST: 15 IU/L (ref 0–40)
Albumin/Globulin Ratio: 1.9 (ref 1.2–2.2)
Albumin: 4.6 g/dL (ref 3.5–4.6)
Alkaline Phosphatase: 61 IU/L (ref 39–117)
BUN/Creatinine Ratio: 14 (ref 10–24)
BUN: 15 mg/dL (ref 10–36)
Bilirubin Total: 0.6 mg/dL (ref 0.0–1.2)
CO2: 22 mmol/L (ref 20–29)
Calcium: 9.5 mg/dL (ref 8.6–10.2)
Chloride: 102 mmol/L (ref 96–106)
Creatinine, Ser: 1.05 mg/dL (ref 0.76–1.27)
GFR calc Af Amer: 71 mL/min/{1.73_m2} (ref >59)
GFR calc non Af Amer: 62 mL/min/{1.73_m2} (ref >59)
Globulin, Total: 2.4 g/dL (ref 1.5–4.5)
Glucose: 104 mg/dL — ABNORMAL HIGH (ref 65–99)
Potassium: 4 mmol/L (ref 3.5–5.2)
Sodium: 140 mmol/L (ref 134–144)
Total Protein: 7 g/dL (ref 6.0–8.5)

## 2018-06-09 LAB — BAYER DCA HB A1C WAIVED: HB A1C (BAYER DCA - WAIVED): 5.4 % (ref <7.0)

## 2018-06-09 NOTE — Patient Instructions (Signed)

## 2018-06-09 NOTE — Progress Notes (Signed)
Subjective:    Patient ID: [name], male    DOB: [birth year], 84 y.o.   MRN: [id number]  Chief Complaint  Patient presents with   Medical Management of Chronic Issues   Diabetes   Pt presents to the office today for chronic follow up.  Hypertension  This is a chronic problem. The current episode started more than 1 year ago. The problem has been waxing and waning since onset. The problem is uncontrolled. Associated symptoms include shortness of breath. Pertinent negatives include no blurred vision, headaches, malaise/fatigue or peripheral edema. Risk factors for coronary artery disease include family history, dyslipidemia, male gender, obesity and sedentary lifestyle. The current treatment provides moderate improvement. There is no history of kidney disease, CAD/MI, CVA or heart failure. Identifiable causes of hypertension include a thyroid problem.  Diabetes  He presents for his follow-up diabetic visit. He has type 2 diabetes mellitus. His disease course has been stable. Pertinent negatives for hypoglycemia include no headaches. Associated symptoms include fatigue. Pertinent negatives for diabetes include no blurred vision, no foot paresthesias and no visual change. Symptoms are stable. Pertinent negatives for diabetic complications include no CVA or heart disease. Risk factors for coronary artery disease include dyslipidemia, diabetes mellitus, male sex, hypertension and sedentary lifestyle. He is following a generally healthy diet. His overall blood glucose range is 110-130 mg/dl.  Hyperlipidemia  This is a chronic problem. The current episode started more than 1 year ago. The problem is controlled. Recent lipid tests were reviewed and are normal. Exacerbating diseases include obesity. Associated symptoms include shortness of breath. Current antihyperlipidemic treatment includes statins. The current treatment provides moderate improvement of lipids. Risk factors for coronary artery disease  include family history, dyslipidemia, diabetes mellitus, male sex, hypertension and a sedentary lifestyle.  Thyroid Problem  Presents for follow-up visit. Symptoms include fatigue. Patient reports no constipation, depressed mood or visual change. The symptoms have been stable. His past medical history is significant for hyperlipidemia. There is no history of heart failure.  COPD Quit smoking 1983. States he has intermittent SOB. Does not use any inhalers at this time.     Review of Systems  Constitutional: Positive for fatigue. Negative for malaise/fatigue.  Eyes: Negative for blurred vision.  Respiratory: Positive for shortness of breath.   Gastrointestinal: Negative for constipation.  Neurological: Negative for headaches.       Objective:   Physical Exam Vitals signs reviewed.  Constitutional:      General: He is not in acute distress.    Appearance: He is well-developed.  HENT:     Head: Normocephalic.     Right Ear: External ear normal.     Left Ear: External ear normal.  Eyes:     General:        Right eye: No discharge.        Left eye: No discharge.     Pupils: Pupils are equal, round, and reactive to light.  Neck:     Musculoskeletal: Normal range of motion and neck supple.     Thyroid: No thyromegaly.  Cardiovascular:     Rate and Rhythm: Normal rate and regular rhythm.     Heart sounds: Normal heart sounds. No murmur.  Pulmonary:     Effort: Pulmonary effort is normal. No respiratory distress.     Breath sounds: Normal breath sounds. No wheezing.  Abdominal:     General: Bowel sounds are normal. There is no distension.     Palpations: Abdomen is soft.  Tenderness: There is no abdominal tenderness.  Musculoskeletal:        General: No tenderness.     Right lower leg: Edema (trace) present.     Left lower leg: No edema.  Skin:    General: Skin is warm and dry.     Findings: No erythema or rash.  Neurological:     Mental Status: He is alert and oriented  to person, place, and time.     Cranial Nerves: No cranial nerve deficit.     Deep Tendon Reflexes: Reflexes are normal and symmetric.  Psychiatric:        Behavior: Behavior normal.        Thought Content: Thought content normal.        Judgment: Judgment normal.       BP (!) 201/98    Pulse 64    Temp (!) 96.8 F (36 C) (Oral)    Ht 5' 11.5" (1.816 m)    Wt 219 lb 3.2 oz (99.4 kg)    BMI 30.15 kg/m      Assessment & Plan:  [name] comes in today with chief complaint of Medical Management of Chronic Issues and Diabetes   Diagnosis and orders addressed:  1. Chronic obstructive pulmonary disease, unspecified COPD type ([place]) - CMP14+EGFR  2. Type 2 diabetes mellitus with other specified complication, without long-term current use of insulin (HCC) - CMP14+EGFR - Bayer DCA Hb A1c Waived - Microalbumin / creatinine urine ratio  3. Essential hypertension Pt's BP greatly improved after he sat. Daughter states this is his first time coming out of the house in weeks and his first time wearing a mask. She will check his BP at home and if continually greater than 170-180/90 she will call and we will change medication.  - CMP14+EGFR  4. Hyperlipidemia associated with type 2 diabetes mellitus (HCC) - CMP14+EGFR  5. Hypothyroidism, unspecified type - CMP14+EGFR  6. Obesity (BMI 30-39.9) - CMP14+EGFR   Labs pending Health Maintenance reviewed Diet and exercise encouraged  Follow up plan: 2 weeks if we change his BP medication after he checks his BP at home for a few days. If BP is stable at home will follow up in  4 months.   Evelina Dun, FNP

## 2018-06-10 ENCOUNTER — Other Ambulatory Visit: Payer: Self-pay | Admitting: Family

## 2018-06-10 LAB — MICROALBUMIN / CREATININE URINE RATIO
Creatinine, Urine: 47.7 mg/dL
Microalb/Creat Ratio: 25 mg/g creat (ref 0–29)
Microalbumin, Urine: 12 ug/mL

## 2018-06-16 ENCOUNTER — Encounter: Payer: Self-pay | Admitting: *Deleted

## 2018-06-26 DIAGNOSIS — C44622 Squamous cell carcinoma of skin of right upper limb, including shoulder: Secondary | ICD-10-CM | POA: Diagnosis not present

## 2018-06-26 DIAGNOSIS — X32XXXA Exposure to sunlight, initial encounter: Secondary | ICD-10-CM | POA: Diagnosis not present

## 2018-06-26 DIAGNOSIS — L820 Inflamed seborrheic keratosis: Secondary | ICD-10-CM | POA: Diagnosis not present

## 2018-06-26 DIAGNOSIS — C44229 Squamous cell carcinoma of skin of left ear and external auricular canal: Secondary | ICD-10-CM | POA: Diagnosis not present

## 2018-06-26 DIAGNOSIS — L570 Actinic keratosis: Secondary | ICD-10-CM | POA: Diagnosis not present

## 2018-06-26 DIAGNOSIS — L859 Epidermal thickening, unspecified: Secondary | ICD-10-CM | POA: Diagnosis not present

## 2018-07-28 DIAGNOSIS — C44229 Squamous cell carcinoma of skin of left ear and external auricular canal: Secondary | ICD-10-CM | POA: Diagnosis not present

## 2018-07-28 DIAGNOSIS — C44622 Squamous cell carcinoma of skin of right upper limb, including shoulder: Secondary | ICD-10-CM | POA: Diagnosis not present

## 2018-08-14 ENCOUNTER — Telehealth: Payer: Self-pay | Admitting: Family

## 2018-08-14 NOTE — Chronic Care Management (AMB) (Signed)
°  Chronic Care Management   Outreach Note  08/14/2018 Name: [name] MRN: [id number] DOB: [birth year]  Referred by: Sharion Balloon, FNP Reason for referral : Chronic Care Management (Initial CCM outreach was unsuccessful. )   An unsuccessful telephone outreach was attempted today. The patient was referred to the case management team by for assistance with chronic care management and care coordination.   Follow Up Plan: A HIPPA compliant phone message was left for the patient providing contact information and requesting a return call.  The care management team will reach out to the patient again over the next 7 days.  If patient returns call to provider office, please advise to call [facility] at [place]  [email or web address]   ??[phone]

## 2018-08-19 NOTE — Chronic Care Management (AMB) (Signed)
°  Chronic Care Management   Outreach Note  08/19/2018 Name: [name] MRN: [id number] DOB: 1924/04/25  Referred by: Sharion Balloon, FNP Reason for referral : Chronic Care Management (Initial CCM outreach was unsuccessful. ) and Chronic Care Management (Second CCM outreach was unsuccessful)   A second unsuccessful telephone outreach was attempted today. The patient was referred to the case management team for assistance with chronic care management and care coordination.   Follow Up Plan: A HIPPA compliant phone message was left for the patient providing contact information and requesting a return call.  The care management team will reach out to the patient again over the next 7 days.  If patient returns call to provider office, please advise to call [facility]  [email or web address]   ??[phone]

## 2018-08-27 NOTE — Chronic Care Management (AMB) (Signed)
°  Chronic Care Management   Outreach Note  08/27/2018 Name: [name] MRN: [id number] DOB: Oct 19, 1924  Referred by: Sharion Balloon, FNP Reason for referral : Chronic Care Management (Initial CCM outreach was unsuccessful. ), Chronic Care Management (Second CCM outreach was unsuccessful), and Chronic Care Management (Third CCM outreac was unsuccessfu. )   Third unsuccessful telephone outreach was attempted today. The patient was referred to the case management team for assistance with chronic care management and care coordination. The patient's primary care provider has been notified of our unsuccessful attempts to make or maintain contact with the patient. The care management team is pleased to engage with this patient at any time in the future should he/she be interested in assistance from the care management team.   Follow Up Plan: The care management team is available to follow up with the patient after provider conversation with the patient regarding recommendation for care management engagement and subsequent re-referral to the care management team.   [place]  [email or web address]   ??[phone]

## 2018-09-29 DIAGNOSIS — Z85828 Personal history of other malignant neoplasm of skin: Secondary | ICD-10-CM | POA: Diagnosis not present

## 2018-09-29 DIAGNOSIS — L905 Scar conditions and fibrosis of skin: Secondary | ICD-10-CM | POA: Diagnosis not present

## 2018-09-29 DIAGNOSIS — D485 Neoplasm of uncertain behavior of skin: Secondary | ICD-10-CM | POA: Diagnosis not present

## 2018-09-29 DIAGNOSIS — Z08 Encounter for follow-up examination after completed treatment for malignant neoplasm: Secondary | ICD-10-CM | POA: Diagnosis not present

## 2018-10-17 ENCOUNTER — Other Ambulatory Visit: Payer: Self-pay | Admitting: Family

## 2018-10-17 DIAGNOSIS — E039 Hypothyroidism, unspecified: Secondary | ICD-10-CM

## 2018-10-17 DIAGNOSIS — E1169 Type 2 diabetes mellitus with other specified complication: Secondary | ICD-10-CM

## 2018-10-17 DIAGNOSIS — E785 Hyperlipidemia, unspecified: Secondary | ICD-10-CM

## 2018-11-14 ENCOUNTER — Other Ambulatory Visit: Payer: Self-pay

## 2018-11-14 ENCOUNTER — Ambulatory Visit (INDEPENDENT_AMBULATORY_CARE_PROVIDER_SITE_OTHER): Payer: Medicare HMO

## 2018-11-14 DIAGNOSIS — Z23 Encounter for immunization: Secondary | ICD-10-CM | POA: Diagnosis not present

## 2019-01-15 ENCOUNTER — Other Ambulatory Visit: Payer: Self-pay | Admitting: Family

## 2019-01-15 DIAGNOSIS — E1169 Type 2 diabetes mellitus with other specified complication: Secondary | ICD-10-CM

## 2019-01-15 DIAGNOSIS — E785 Hyperlipidemia, unspecified: Secondary | ICD-10-CM

## 2019-02-05 DIAGNOSIS — H40033 Anatomical narrow angle, bilateral: Secondary | ICD-10-CM | POA: Diagnosis not present

## 2019-02-05 DIAGNOSIS — H2513 Age-related nuclear cataract, bilateral: Secondary | ICD-10-CM | POA: Diagnosis not present

## 2019-02-28 ENCOUNTER — Other Ambulatory Visit: Payer: Self-pay | Admitting: Family

## 2019-02-28 DIAGNOSIS — I10 Essential (primary) hypertension: Secondary | ICD-10-CM

## 2019-03-09 DIAGNOSIS — Z08 Encounter for follow-up examination after completed treatment for malignant neoplasm: Secondary | ICD-10-CM | POA: Diagnosis not present

## 2019-03-09 DIAGNOSIS — Z85828 Personal history of other malignant neoplasm of skin: Secondary | ICD-10-CM | POA: Diagnosis not present

## 2019-03-09 DIAGNOSIS — L905 Scar conditions and fibrosis of skin: Secondary | ICD-10-CM | POA: Diagnosis not present

## 2019-03-11 ENCOUNTER — Other Ambulatory Visit: Payer: Self-pay | Admitting: Family

## 2019-03-11 DIAGNOSIS — I10 Essential (primary) hypertension: Secondary | ICD-10-CM

## 2019-03-12 NOTE — Telephone Encounter (Signed)
[facility] NTBS LOV 06/09/2018 Mail order not sent

## 2019-03-12 NOTE — Telephone Encounter (Signed)
Left message to please call our office to schedule an office visit for check up ,lab work and refills.

## 2019-03-16 ENCOUNTER — Other Ambulatory Visit: Payer: Self-pay

## 2019-03-17 ENCOUNTER — Other Ambulatory Visit: Payer: Self-pay | Admitting: Family

## 2019-03-17 ENCOUNTER — Ambulatory Visit (INDEPENDENT_AMBULATORY_CARE_PROVIDER_SITE_OTHER): Payer: Medicare HMO | Admitting: Family

## 2019-03-17 ENCOUNTER — Encounter: Payer: Self-pay | Admitting: Family

## 2019-03-17 VITALS — BP 186/91 | HR 62 | Temp 96.0°F | Ht 71.5 in | Wt 225.4 lb

## 2019-03-17 DIAGNOSIS — E785 Hyperlipidemia, unspecified: Secondary | ICD-10-CM

## 2019-03-17 DIAGNOSIS — I10 Essential (primary) hypertension: Secondary | ICD-10-CM | POA: Diagnosis not present

## 2019-03-17 DIAGNOSIS — E669 Obesity, unspecified: Secondary | ICD-10-CM | POA: Diagnosis not present

## 2019-03-17 DIAGNOSIS — J449 Chronic obstructive pulmonary disease, unspecified: Secondary | ICD-10-CM | POA: Diagnosis not present

## 2019-03-17 DIAGNOSIS — E1142 Type 2 diabetes mellitus with diabetic polyneuropathy: Secondary | ICD-10-CM

## 2019-03-17 DIAGNOSIS — E1169 Type 2 diabetes mellitus with other specified complication: Secondary | ICD-10-CM

## 2019-03-17 DIAGNOSIS — M1990 Unspecified osteoarthritis, unspecified site: Secondary | ICD-10-CM

## 2019-03-17 DIAGNOSIS — E039 Hypothyroidism, unspecified: Secondary | ICD-10-CM | POA: Diagnosis not present

## 2019-03-17 LAB — BAYER DCA HB A1C WAIVED: HB A1C (BAYER DCA - WAIVED): 6.2 % (ref <7.0)

## 2019-03-17 MED ORDER — BENAZEPRIL HCL 40 MG PO TABS: 40.0000 mg | ORAL_TABLET | Freq: Every day | ORAL | 3 refills | Status: DC

## 2019-03-17 MED ORDER — AMLODIPINE BESYLATE 5 MG PO TABS: 5.0000 mg | ORAL_TABLET | Freq: Every day | ORAL | 0 refills | Status: DC

## 2019-03-17 MED ORDER — ACCU-CHEK AVIVA PLUS VI STRP: ORAL_STRIP | 3 refills | Status: DC

## 2019-03-17 MED ORDER — LEVOTHYROXINE SODIUM 100 MCG PO TABS: 100.0000 ug | ORAL_TABLET | Freq: Every day | ORAL | 1 refills | Status: DC

## 2019-03-17 MED ORDER — PRAVASTATIN SODIUM 40 MG PO TABS: 40.0000 mg | ORAL_TABLET | Freq: Every day | ORAL | 0 refills | Status: DC

## 2019-03-17 NOTE — Progress Notes (Signed)
Subjective:    Patient ID: [name], male    DOB: 03-15-1924, 86 y.o.   MRN: [id number]  Chief Complaint  Patient presents with  . Medical Management of Chronic Issues    3 mth    PT presents to the office today for chronic follow up.  Diabetes [name] presents for his follow-up diabetic visit. [name] has type 2 diabetes mellitus. His disease course has been stable. There are no hypoglycemic associated symptoms. Pertinent negatives for diabetes include no blurred vision, no fatigue and no foot paresthesias. There are no hypoglycemic complications. Symptoms are stable. Pertinent negatives for diabetic complications include no CVA, heart disease or nephropathy. Risk factors for coronary artery disease include dyslipidemia, diabetes mellitus, male sex, hypertension and sedentary lifestyle. [name] is following a generally healthy diet. His overall blood glucose range is 130-140 mg/dl. Eye exam is current.  Hypertension This is a chronic problem. The current episode started more than 1 year ago. The problem has been resolved since onset. The problem is controlled. Associated symptoms include malaise/fatigue and shortness of breath. Pertinent negatives include no blurred vision or peripheral edema. Risk factors for coronary artery disease include obesity, male gender, sedentary lifestyle, dyslipidemia and diabetes mellitus. There is no history of CVA. Identifiable causes of hypertension include a thyroid problem.  Thyroid Problem Presents for follow-up visit. Patient reports no depressed mood, dry skin or fatigue. The symptoms have been stable. His past medical history is significant for hyperlipidemia.  Hyperlipidemia This is a chronic problem. The current episode started more than 1 year ago. The problem is controlled. Recent lipid tests were reviewed and are normal. Exacerbating diseases include hypothyroidism. Associated symptoms include shortness of breath. Current antihyperlipidemic treatment includes  statins. The current treatment provides moderate improvement of lipids. Risk factors for coronary artery disease include male sex, hypertension and dyslipidemia.  Arthritis Presents for follow-up visit. [name] complains of pain and stiffness. Affected locations include the right MCP and left MCP. His pain is at a severity of 3/10. Pertinent negatives include no fatigue.  COPD Pt has intermittent SOB. Does not use any inhalers at this time.     Review of Systems  Constitutional: Positive for malaise/fatigue. Negative for fatigue.  Eyes: Negative for blurred vision.  Respiratory: Positive for shortness of breath.   Musculoskeletal: Positive for arthritis and stiffness.  All other systems reviewed and are negative.      Objective:   Physical Exam Vitals reviewed.  Constitutional:      General: [name] is not in acute distress.    Appearance: [name] is well-developed.  HENT:     Head: Normocephalic.     Right Ear: Tympanic membrane normal.     Left Ear: Tympanic membrane normal.     Ears:     Comments: HOH Eyes:     General:        Right eye: No discharge.        Left eye: No discharge.     Pupils: Pupils are equal, round, and reactive to light.  Neck:     Thyroid: No thyromegaly.  Cardiovascular:     Rate and Rhythm: Normal rate and regular rhythm.     Heart sounds: Normal heart sounds. No murmur.  Pulmonary:     Effort: Pulmonary effort is normal. No respiratory distress.     Breath sounds: Normal breath sounds. No wheezing.  Abdominal:     General: Bowel sounds are normal. There is no distension.     Palpations: Abdomen  is soft.     Tenderness: There is no abdominal tenderness.  Musculoskeletal:        General: No tenderness. Normal range of motion.     Cervical back: Normal range of motion and neck supple.  Skin:    General: Skin is warm and dry.     Findings: No erythema or rash.  Neurological:     Mental Status: [name] is alert and oriented to person, place, and time.      Cranial Nerves: No cranial nerve deficit.     Deep Tendon Reflexes: Reflexes are normal and symmetric.  Psychiatric:        Behavior: Behavior normal.        Thought Content: Thought content normal.        Judgment: Judgment normal.     BP (!) 186/91   Pulse 62   Temp (!) 96 F (35.6 C) (Temporal)   Ht 5' 11.5" (1.816 m)   Wt 225 lb 6.4 oz (102.2 kg)   SpO2 98%   BMI 31.00 kg/m      Assessment & Plan:  MARTESE VANATTA comes in today with chief complaint of Medical Management of Chronic Issues (3 mth )   Diagnosis and orders addressed:  1. Essential hypertension - amLODipine (NORVASC) 5 MG tablet; Take 1 tablet (5 mg total) by mouth daily. (Needs to be seen before next refill)  Dispense: 90 tablet; Refill: 0 - benazepril (LOTENSIN) 40 MG tablet; Take 1 tablet (40 mg total) by mouth daily.  Dispense: 90 tablet; Refill: 3 - CMP14+EGFR - CBC with Differential/Platelet  2. Hyperlipidemia associated with type 2 diabetes mellitus (HCC) - pravastatin (PRAVACHOL) 40 MG tablet; Take 1 tablet (40 mg total) by mouth daily.  Dispense: 90 tablet; Refill: 0 - CMP14+EGFR - CBC with Differential/Platelet - Lipid panel  3. Hypothyroidism, unspecified type - levothyroxine (SYNTHROID) 100 MCG tablet; Take 1 tablet (100 mcg total) by mouth daily before breakfast.  Dispense: 90 tablet; Refill: 1 - CMP14+EGFR - CBC with Differential/Platelet - TSH  4. Type 2 diabetes mellitus with diabetic polyneuropathy, without long-term current use of insulin (HCC) - glucose blood (ACCU-CHEK AVIVA PLUS) test strip; Test blood glucose twice daily  Dispense: 300 each; Refill: 3 - CMP14+EGFR - CBC with Differential/Platelet - Bayer DCA Hb A1c Waived - Microalbumin / creatinine urine ratio  5. Chronic obstructive pulmonary disease, unspecified COPD type ([place]) - CMP14+EGFR - CBC with Differential/Platelet  6. Arthritis - CMP14+EGFR - CBC with Differential/Platelet  7. Obesity (BMI 30-39.9) -  CMP14+EGFR - CBC with Differential/Platelet   Labs pending Health Maintenance reviewed Diet and exercise encouraged  Follow up plan: 6 months   Evelina Dun, FNP

## 2019-03-17 NOTE — Patient Instructions (Signed)

## 2019-03-18 LAB — LIPID PANEL
Chol/HDL Ratio: 3 ratio (ref 0.0–5.0)
Cholesterol, Total: 151 mg/dL (ref 100–199)
HDL: 50 mg/dL (ref >39)
LDL Chol Calc (NIH): 84 mg/dL (ref 0–99)
Triglycerides: 89 mg/dL (ref 0–149)
VLDL Cholesterol Cal: 17 mg/dL (ref 5–40)

## 2019-03-18 LAB — CMP14+EGFR
ALT: 7 IU/L (ref 0–44)
AST: 15 IU/L (ref 0–40)
Albumin/Globulin Ratio: 1.5 (ref 1.2–2.2)
Albumin: 4.5 g/dL (ref 3.5–4.6)
Alkaline Phosphatase: 85 IU/L (ref 39–117)
BUN/Creatinine Ratio: 15 (ref 10–24)
BUN: 16 mg/dL (ref 10–36)
Bilirubin Total: 0.7 mg/dL (ref 0.0–1.2)
CO2: 25 mmol/L (ref 20–29)
Calcium: 9.4 mg/dL (ref 8.6–10.2)
Chloride: 101 mmol/L (ref 96–106)
Creatinine, Ser: 1.05 mg/dL (ref 0.76–1.27)
GFR calc Af Amer: 71 mL/min/{1.73_m2} (ref >59)
GFR calc non Af Amer: 61 mL/min/{1.73_m2} (ref >59)
Globulin, Total: 3 g/dL (ref 1.5–4.5)
Glucose: 114 mg/dL — ABNORMAL HIGH (ref 65–99)
Potassium: 3.8 mmol/L (ref 3.5–5.2)
Sodium: 139 mmol/L (ref 134–144)
Total Protein: 7.5 g/dL (ref 6.0–8.5)

## 2019-03-18 LAB — CBC WITH DIFFERENTIAL/PLATELET
Basophils Absolute: 0 10*3/uL (ref 0.0–0.2)
Basos: 0 %
EOS (ABSOLUTE): 0.2 10*3/uL (ref 0.0–0.4)
Eos: 3 %
Hematocrit: 39.4 % (ref 37.5–51.0)
Hemoglobin: 14 g/dL (ref 13.0–17.7)
Immature Grans (Abs): 0 10*3/uL (ref 0.0–0.1)
Immature Granulocytes: 0 %
Lymphocytes Absolute: 2.2 10*3/uL (ref 0.7–3.1)
Lymphs: 42 %
MCH: 33.7 pg — ABNORMAL HIGH (ref 26.6–33.0)
MCHC: 35.5 g/dL (ref 31.5–35.7)
MCV: 95 fL (ref 79–97)
Monocytes Absolute: 0.5 10*3/uL (ref 0.1–0.9)
Monocytes: 10 %
Neutrophils Absolute: 2.4 10*3/uL (ref 1.4–7.0)
Neutrophils: 45 %
Platelets: 107 10*3/uL — ABNORMAL LOW (ref 150–450)
RBC: 4.15 x10E6/uL (ref 4.14–5.80)
RDW: 13.1 % (ref 11.6–15.4)
WBC: 5.4 10*3/uL (ref 3.4–10.8)

## 2019-03-18 LAB — MICROALBUMIN / CREATININE URINE RATIO
Creatinine, Urine: 85 mg/dL
Microalb/Creat Ratio: 18 mg/g creat (ref 0–29)
Microalbumin, Urine: 15.6 ug/mL

## 2019-03-18 LAB — TSH: TSH: 1.23 u[IU]/mL (ref 0.450–4.500)

## 2019-07-22 ENCOUNTER — Other Ambulatory Visit: Payer: Self-pay | Admitting: Family

## 2019-07-22 DIAGNOSIS — E785 Hyperlipidemia, unspecified: Secondary | ICD-10-CM

## 2019-08-24 DIAGNOSIS — L570 Actinic keratosis: Secondary | ICD-10-CM | POA: Diagnosis not present

## 2019-08-24 DIAGNOSIS — D030 Melanoma in situ of lip: Secondary | ICD-10-CM | POA: Diagnosis not present

## 2019-08-24 DIAGNOSIS — X32XXXA Exposure to sunlight, initial encounter: Secondary | ICD-10-CM | POA: Diagnosis not present

## 2019-08-24 DIAGNOSIS — L820 Inflamed seborrheic keratosis: Secondary | ICD-10-CM | POA: Diagnosis not present

## 2019-08-26 ENCOUNTER — Other Ambulatory Visit: Payer: Self-pay | Admitting: Family

## 2019-08-26 DIAGNOSIS — E039 Hypothyroidism, unspecified: Secondary | ICD-10-CM

## 2019-09-16 DIAGNOSIS — D030 Melanoma in situ of lip: Secondary | ICD-10-CM | POA: Diagnosis not present

## 2019-09-16 DIAGNOSIS — L988 Other specified disorders of the skin and subcutaneous tissue: Secondary | ICD-10-CM | POA: Diagnosis not present

## 2019-09-25 DIAGNOSIS — H10013 Acute follicular conjunctivitis, bilateral: Secondary | ICD-10-CM | POA: Diagnosis not present

## 2019-10-22 ENCOUNTER — Other Ambulatory Visit: Payer: Self-pay | Admitting: Family

## 2019-10-22 DIAGNOSIS — E1169 Type 2 diabetes mellitus with other specified complication: Secondary | ICD-10-CM

## 2019-11-05 ENCOUNTER — Other Ambulatory Visit: Payer: Self-pay

## 2019-11-05 ENCOUNTER — Encounter: Payer: Self-pay | Admitting: Family

## 2019-11-05 ENCOUNTER — Ambulatory Visit (INDEPENDENT_AMBULATORY_CARE_PROVIDER_SITE_OTHER): Payer: Medicare HMO | Admitting: Family

## 2019-11-05 VITALS — BP 134/75 | HR 71 | Temp 96.8°F | Ht 71.5 in | Wt 221.4 lb

## 2019-11-05 DIAGNOSIS — J449 Chronic obstructive pulmonary disease, unspecified: Secondary | ICD-10-CM

## 2019-11-05 DIAGNOSIS — E039 Hypothyroidism, unspecified: Secondary | ICD-10-CM | POA: Diagnosis not present

## 2019-11-05 DIAGNOSIS — M1990 Unspecified osteoarthritis, unspecified site: Secondary | ICD-10-CM | POA: Diagnosis not present

## 2019-11-05 DIAGNOSIS — E785 Hyperlipidemia, unspecified: Secondary | ICD-10-CM

## 2019-11-05 DIAGNOSIS — I10 Essential (primary) hypertension: Secondary | ICD-10-CM | POA: Diagnosis not present

## 2019-11-05 DIAGNOSIS — E669 Obesity, unspecified: Secondary | ICD-10-CM

## 2019-11-05 DIAGNOSIS — E1169 Type 2 diabetes mellitus with other specified complication: Secondary | ICD-10-CM

## 2019-11-05 DIAGNOSIS — Z23 Encounter for immunization: Secondary | ICD-10-CM

## 2019-11-05 LAB — BAYER DCA HB A1C WAIVED: HB A1C (BAYER DCA - WAIVED): 6.4 % (ref <7.0)

## 2019-11-05 MED ORDER — BENAZEPRIL HCL 40 MG PO TABS: 40.0000 mg | ORAL_TABLET | Freq: Every day | ORAL | 3 refills | Status: DC

## 2019-11-05 MED ORDER — LEVOTHYROXINE SODIUM 100 MCG PO TABS: ORAL_TABLET | ORAL | 1 refills | Status: DC

## 2019-11-05 MED ORDER — PRAVASTATIN SODIUM 40 MG PO TABS: 40.0000 mg | ORAL_TABLET | Freq: Every day | ORAL | 2 refills | Status: DC

## 2019-11-05 MED ORDER — AMLODIPINE BESYLATE 5 MG PO TABS: 5.0000 mg | ORAL_TABLET | Freq: Every day | ORAL | 2 refills | Status: DC

## 2019-11-05 NOTE — Patient Instructions (Signed)
Health Maintenance After Age 84 After age 84, you are at a higher risk for certain long-term diseases and infections as well as injuries from falls. Falls are a major cause of broken bones and head injuries in people who are older than age 84. Getting regular preventive care can help to keep you healthy and well. Preventive care includes getting regular testing and making lifestyle changes as recommended by your health care provider. Talk with your health care provider about:  Which screenings and tests you should have. A screening is a test that checks for a disease when you have no symptoms.  A diet and exercise plan that is right for you. What should I know about screenings and tests to prevent falls? Screening and testing are the best ways to find a health problem early. Early diagnosis and treatment give you the best chance of managing medical conditions that are common after age 84. Certain conditions and lifestyle choices may make you more likely to have a fall. Your health care provider may recommend:  Regular vision checks. Poor vision and conditions such as cataracts can make you more likely to have a fall. If you wear glasses, make sure to get your prescription updated if your vision changes.  Medicine review. Work with your health care provider to regularly review all of the medicines you are taking, including over-the-counter medicines. Ask your health care provider about any side effects that may make you more likely to have a fall. Tell your health care provider if any medicines that you take make you feel dizzy or sleepy.  Osteoporosis screening. Osteoporosis is a condition that causes the bones to get weaker. This can make the bones weak and cause them to break more easily.  Blood pressure screening. Blood pressure changes and medicines to control blood pressure can make you feel dizzy.  Strength and balance checks. Your health care provider may recommend certain tests to check your  strength and balance while standing, walking, or changing positions.  Foot health exam. Foot pain and numbness, as well as not wearing proper footwear, can make you more likely to have a fall.  Depression screening. You may be more likely to have a fall if you have a fear of falling, feel emotionally low, or feel unable to do activities that you used to do.  Alcohol use screening. Using too much alcohol can affect your balance and may make you more likely to have a fall. What actions can I take to lower my risk of falls? General instructions  Talk with your health care provider about your risks for falling. Tell your health care provider if: ? You fall. Be sure to tell your health care provider about all falls, even ones that seem minor. ? You feel dizzy, sleepy, or off-balance.  Take over-the-counter and prescription medicines only as told by your health care provider. These include any supplements.  Eat a healthy diet and maintain a healthy weight. A healthy diet includes low-fat dairy products, low-fat (lean) meats, and fiber from whole grains, beans, and lots of fruits and vegetables. Home safety  Remove any tripping hazards, such as rugs, cords, and clutter.  Install safety equipment such as grab bars in bathrooms and safety rails on stairs.  Keep rooms and walkways well-lit. Activity   Follow a regular exercise program to stay fit. This will help you maintain your balance. Ask your health care provider what types of exercise are appropriate for you.  If you need a cane or   walker, use it as recommended by your health care provider.  Wear supportive shoes that have nonskid soles. Lifestyle  Do not drink alcohol if your health care provider tells you not to drink.  If you drink alcohol, limit how much you have: ? 0-1 drink a day for women. ? 0-2 drinks a day for men.  Be aware of how much alcohol is in your drink. In the [place] one drink equals one typical bottle of beer (12  oz), one-half glass of wine (5 oz), or one shot of hard liquor (1 oz).  Do not use any products that contain nicotine or tobacco, such as cigarettes and e-cigarettes. If you need help quitting, ask your health care provider. Summary  Having a healthy lifestyle and getting preventive care can help to protect your health and wellness after age 84.  Screening and testing are the best way to find a health problem early and help you avoid having a fall. Early diagnosis and treatment give you the best chance for managing medical conditions that are more common for people who are older than age 84.  Falls are a major cause of broken bones and head injuries in people who are older than age 84. Take precautions to prevent a fall at home.  Work with your health care provider to learn what changes you can make to improve your health and wellness and to prevent falls. This information is not intended to replace advice given to you by your health care provider. Make sure you discuss any questions you have with your health care provider. Document Revised: 05/08/2018 Document Reviewed: 11/28/2016 Elsevier Patient Education  2020 Elsevier Inc.  

## 2019-11-05 NOTE — Progress Notes (Signed)
Subjective:    Patient ID: [name], male    DOB: 18-Dec-1924, 86 y.o.   MRN: [id number]  Chief Complaint  Patient presents with  . Medical Management of Chronic Issues   PT presents to the office today for chronic follow up.  Hypertension This is a chronic problem. The current episode started more than 1 year ago. The problem has been resolved since onset. The problem is controlled. Associated symptoms include malaise/fatigue. Pertinent negatives include no peripheral edema or shortness of breath. Risk factors for coronary artery disease include obesity and male gender. Identifiable causes of hypertension include a thyroid problem.  Thyroid Problem Presents for follow-up visit. Patient reports no constipation, diarrhea or hoarse voice. The symptoms have been stable. His past medical history is significant for hyperlipidemia.  Hyperlipidemia This is a chronic problem. The current episode started more than 1 year ago. The problem is controlled. Recent lipid tests were reviewed and are normal. Exacerbating diseases include obesity. Pertinent negatives include no shortness of breath. Current antihyperlipidemic treatment includes statins. The current treatment provides moderate improvement of lipids. Risk factors for coronary artery disease include diabetes mellitus, hypertension, male sex and obesity.      Review of Systems  Constitutional: Positive for malaise/fatigue.  HENT: Negative for hoarse voice.   Respiratory: Negative for shortness of breath.   Gastrointestinal: Negative for constipation and diarrhea.  All other systems reviewed and are negative.      Objective:   Physical Exam Vitals reviewed.  Constitutional:      General: He is not in acute distress.    Appearance: He is well-developed. He is obese.  HENT:     Head: Normocephalic.     Right Ear: Tympanic membrane normal.     Left Ear: Tympanic membrane normal.     Ears:     Comments: HOH  Eyes:     General:         Right eye: No discharge.        Left eye: No discharge.     Pupils: Pupils are equal, round, and reactive to light.  Neck:     Thyroid: No thyromegaly.  Cardiovascular:     Rate and Rhythm: Normal rate and regular rhythm.     Heart sounds: Normal heart sounds. No murmur heard.   Pulmonary:     Effort: Pulmonary effort is normal. No respiratory distress.     Breath sounds: Normal breath sounds. No wheezing.  Abdominal:     General: Bowel sounds are normal. There is no distension.     Palpations: Abdomen is soft.     Tenderness: There is no abdominal tenderness.  Musculoskeletal:        General: Tenderness present.     Cervical back: Normal range of motion and neck supple.  Skin:    General: Skin is warm and dry.     Findings: No erythema or rash.  Neurological:     Mental Status: He is alert and oriented to person, place, and time.     Cranial Nerves: No cranial nerve deficit.     Motor: Weakness present.     Deep Tendon Reflexes: Reflexes are normal and symmetric.  Psychiatric:        Behavior: Behavior normal.        Thought Content: Thought content normal.        Judgment: Judgment normal.     BP 134/75   Pulse 71   Temp (!) 96.8 F (36 C) (Temporal)  Ht 5' 11.5" (1.816 m)   Wt 221 lb 6.4 oz (100.4 kg)   SpO2 95%   BMI 30.45 kg/m      Assessment & Plan:  [name] comes in today with chief complaint of Medical Management of Chronic Issues   Diagnosis and orders addressed:  1. Essential hypertension - benazepril (LOTENSIN) 40 MG tablet; Take 1 tablet (40 mg total) by mouth daily.  Dispense: 90 tablet; Refill: 3 - amLODipine (NORVASC) 5 MG tablet; Take 1 tablet (5 mg total) by mouth daily. (Needs to be seen before next refill)  Dispense: 90 tablet; Refill: 2 - CMP14+EGFR - CBC with Differential/Platelet  2. Hyperlipidemia associated with type 2 diabetes mellitus (HCC) - pravastatin (PRAVACHOL) 40 MG tablet; Take 1 tablet (40 mg total) by mouth daily.   Dispense: 90 tablet; Refill: 2 - CMP14+EGFR - CBC with Differential/Platelet  3. Hypothyroidism, unspecified type - levothyroxine (SYNTHROID) 100 MCG tablet; TAKE 1 TABLET EVERY DAY BEFORE BREAKFAST  Dispense: 90 tablet; Refill: 1 - CMP14+EGFR - CBC with Differential/Platelet - TSH  4. Need for immunization against influenza - Flu Vaccine QUAD High Dose(Fluad) - CMP14+EGFR - CBC with Differential/Platelet  5. Chronic obstructive pulmonary disease, unspecified COPD type ([place]) - CMP14+EGFR - CBC with Differential/Platelet  6. Type 2 diabetes mellitus with other specified complication, without long-term current use of insulin (HCC) - Bayer DCA Hb A1c Waived - CMP14+EGFR - CBC with Differential/Platelet  7. Arthritis - CMP14+EGFR - CBC with Differential/Platelet  8. Obesity (BMI 30-39.9) - CMP14+EGFR - CBC with Differential/Platelet   Labs pending Health Maintenance reviewed Diet and exercise encouraged  Follow up plan: 6 months    Evelina Dun, FNP

## 2019-11-06 LAB — CBC WITH DIFFERENTIAL/PLATELET
Basophils Absolute: 0 10*3/uL (ref 0.0–0.2)
Basos: 0 %
EOS (ABSOLUTE): 0.2 10*3/uL (ref 0.0–0.4)
Eos: 3 %
Hematocrit: 40.3 % (ref 37.5–51.0)
Hemoglobin: 13.9 g/dL (ref 13.0–17.7)
Immature Grans (Abs): 0 10*3/uL (ref 0.0–0.1)
Immature Granulocytes: 0 %
Lymphocytes Absolute: 2.3 10*3/uL (ref 0.7–3.1)
Lymphs: 44 %
MCH: 33.8 pg — ABNORMAL HIGH (ref 26.6–33.0)
MCHC: 34.5 g/dL (ref 31.5–35.7)
MCV: 98 fL — ABNORMAL HIGH (ref 79–97)
Monocytes Absolute: 0.5 10*3/uL (ref 0.1–0.9)
Monocytes: 10 %
Neutrophils Absolute: 2.3 10*3/uL (ref 1.4–7.0)
Neutrophils: 43 %
Platelets: 122 10*3/uL — ABNORMAL LOW (ref 150–450)
RBC: 4.11 x10E6/uL — ABNORMAL LOW (ref 4.14–5.80)
RDW: 13.1 % (ref 11.6–15.4)
WBC: 5.3 10*3/uL (ref 3.4–10.8)

## 2019-11-06 LAB — CMP14+EGFR
ALT: 8 IU/L (ref 0–44)
AST: 13 IU/L (ref 0–40)
Albumin/Globulin Ratio: 1.8 (ref 1.2–2.2)
Albumin: 4.6 g/dL (ref 3.5–4.6)
Alkaline Phosphatase: 63 IU/L (ref 44–121)
BUN/Creatinine Ratio: 16 (ref 10–24)
BUN: 18 mg/dL (ref 10–36)
Bilirubin Total: 0.6 mg/dL (ref 0.0–1.2)
CO2: 24 mmol/L (ref 20–29)
Calcium: 9.5 mg/dL (ref 8.6–10.2)
Chloride: 101 mmol/L (ref 96–106)
Creatinine, Ser: 1.16 mg/dL (ref 0.76–1.27)
GFR calc Af Amer: 63 mL/min/{1.73_m2} (ref >59)
GFR calc non Af Amer: 54 mL/min/{1.73_m2} — ABNORMAL LOW (ref >59)
Globulin, Total: 2.5 g/dL (ref 1.5–4.5)
Glucose: 137 mg/dL — ABNORMAL HIGH (ref 65–99)
Potassium: 4.1 mmol/L (ref 3.5–5.2)
Sodium: 138 mmol/L (ref 134–144)
Total Protein: 7.1 g/dL (ref 6.0–8.5)

## 2019-11-06 LAB — TSH: TSH: 1.64 u[IU]/mL (ref 0.450–4.500)

## 2019-11-27 ENCOUNTER — Encounter: Payer: Self-pay | Admitting: Family

## 2019-11-27 ENCOUNTER — Ambulatory Visit (INDEPENDENT_AMBULATORY_CARE_PROVIDER_SITE_OTHER): Payer: Medicare HMO | Admitting: Family

## 2019-11-27 ENCOUNTER — Other Ambulatory Visit: Payer: Self-pay

## 2019-11-27 VITALS — BP 159/73 | HR 71 | Temp 97.5°F | Ht 71.5 in | Wt 226.0 lb

## 2019-11-27 DIAGNOSIS — L249 Irritant contact dermatitis, unspecified cause: Secondary | ICD-10-CM | POA: Diagnosis not present

## 2019-11-27 DIAGNOSIS — R197 Diarrhea, unspecified: Secondary | ICD-10-CM | POA: Diagnosis not present

## 2019-11-27 MED ORDER — TRIAMCINOLONE ACETONIDE 0.5 % EX OINT: 1.0000 "application " | TOPICAL_OINTMENT | Freq: Two times a day (BID) | CUTANEOUS | 0 refills | Status: DC

## 2019-11-27 NOTE — Progress Notes (Signed)
Subjective:    Patient ID: [name], male    DOB: 1925/01/24, 86 y.o.   MRN: [id number]  Chief Complaint  Patient presents with  . Diarrhea    3 days. daughter states has mucus in it    Daughter brings patient today with complaints of diarrhea for the last three days. He was going 10-15 times. He took pepto yesterday and has not gone since. The daughter did states he had mucous in his stool. Denies any blood. Has hx of colitis . Denies any fever or abdominal pain.  Diarrhea  This is a new problem. The current episode started in the past 7 days. The problem occurs more than 10 times per day. The problem has been waxing and waning. The stool consistency is described as mucous. Associated symptoms include increased flatus. Pertinent negatives include no abdominal pain, bloating, coughing, fever, headaches, myalgias or vomiting. There are no known risk factors. He has tried bismuth subsalicylate for the symptoms. The treatment provided moderate relief.  Rash This is a new problem. The current episode started 1 to 4 weeks ago. The problem has been waxing and waning since onset. Location: left lower leg. The rash is characterized by itchiness. He was exposed to nothing. Associated symptoms include diarrhea. Pertinent negatives include no cough, fever or vomiting. Past treatments include anti-itch cream. The treatment provided mild relief.      Review of Systems  Constitutional: Negative for fever.  Respiratory: Negative for cough.   Gastrointestinal: Positive for diarrhea and flatus. Negative for abdominal pain, bloating and vomiting.  Musculoskeletal: Negative for myalgias.  Skin: Positive for rash.  Neurological: Negative for headaches.  All other systems reviewed and are negative.      Objective:   Physical Exam Vitals reviewed.  Constitutional:      General: He is not in acute distress.    Appearance: He is well-developed.  HENT:     Head: Normocephalic.  Eyes:     General:         Right eye: No discharge.        Left eye: No discharge.     Pupils: Pupils are equal, round, and reactive to light.  Neck:     Thyroid: No thyromegaly.  Cardiovascular:     Rate and Rhythm: Normal rate and regular rhythm.     Heart sounds: Normal heart sounds. No murmur heard.   Pulmonary:     Effort: Pulmonary effort is normal. No respiratory distress.     Breath sounds: Normal breath sounds. No wheezing.  Abdominal:     General: There is no distension.     Palpations: Abdomen is soft.     Tenderness: There is no abdominal tenderness (no tenderness noted).     Comments: hyperactive  Musculoskeletal:        General: No tenderness. Normal range of motion.     Cervical back: Normal range of motion and neck supple.  Skin:    General: Skin is warm and dry.     Findings: Rash (left lower medial leg, erythemas) present. No erythema.  Neurological:     Mental Status: He is alert and oriented to person, place, and time.     Cranial Nerves: No cranial nerve deficit.     Deep Tendon Reflexes: Reflexes are normal and symmetric.  Psychiatric:        Behavior: Behavior normal.        Thought Content: Thought content normal.  Judgment: Judgment normal.      BP (!) 159/73   Pulse 71   Temp (!) 97.5 F (36.4 C) (Temporal)   Ht 5' 11.5" (1.816 m)   Wt 226 lb (102.5 kg)   BMI 31.08 kg/m       Assessment & Plan:  MAXTEN SHULER comes in today with chief complaint of Diarrhea (3 days. daughter states has mucus in it )   Diagnosis and orders addressed:  1. Diarrhea, unspecified type Force fluids Bland diet BRAT diet C diff pending Good hand hygiene  - CBC with Differential/Platelet - BMP8+EGFR - Cdiff NAA+O+P+Stool Culture  2. Irritant contact dermatitis, unspecified trigger Do not scratch Keep clean and dry RTO if symptoms worsen or do not improve  - CBC with Differential/Platelet - BMP8+EGFR - triamcinolone ointment (KENALOG) 0.5 %; Apply 1 application  topically 2 (two) times daily.  Dispense: 30 g; Refill: 0    Evelina Dun, FNP

## 2019-11-27 NOTE — Patient Instructions (Signed)
Diarrhea, Adult °Diarrhea is frequent loose and watery bowel movements. Diarrhea can make you feel weak and cause you to become dehydrated. Dehydration can make you tired and thirsty, cause you to have a dry mouth, and decrease how often you urinate. °Diarrhea typically lasts 2-3 days. However, it can last longer if it is a sign of something more serious. It is important to treat your diarrhea as told by your health care provider. °Follow these instructions at home: °Eating and drinking ° °  ° °Follow these recommendations as told by your health care provider: °· Take an oral rehydration solution (ORS). This is an over-the-counter medicine that helps return your body to its normal balance of nutrients and water. It is found at pharmacies and retail stores. °· Drink plenty of fluids, such as water, ice chips, diluted fruit juice, and low-calorie sports drinks. You can drink milk also, if desired. °· Avoid drinking fluids that contain a lot of sugar or caffeine, such as energy drinks, sports drinks, and soda. °· Eat bland, easy-to-digest foods in small amounts as you are able. These foods include bananas, applesauce, rice, lean meats, toast, and crackers. °· Avoid alcohol. °· Avoid spicy or fatty foods. ° °Medicines °· Take over-the-counter and prescription medicines only as told by your health care provider. °· If you were prescribed an antibiotic medicine, take it as told by your health care provider. Do not stop using the antibiotic even if you start to feel better. °General instructions ° °· Wash your hands often using soap and water. If soap and water are not available, use a hand sanitizer. Others in the household should wash their hands as well. Hands should be washed: °? After using the toilet or changing a diaper. °? Before preparing, cooking, or serving food. °? While caring for a sick person or while visiting someone in a hospital. °· Drink enough fluid to keep your urine pale yellow. °· Rest at home while  you recover. °· Watch your condition for any changes. °· Take a warm bath to relieve any burning or pain from frequent diarrhea episodes. °· Keep all follow-up visits as told by your health care provider. This is important. °Contact a health care provider if: °· You have a fever. °· Your diarrhea gets worse. °· You have new symptoms. °· You cannot keep fluids down. °· You feel light-headed or dizzy. °· You have a headache. °· You have muscle cramps. °Get help right away if: °· You have chest pain. °· You feel extremely weak or you faint. °· You have bloody or black stools or stools that look like tar. °· You have severe pain, cramping, or bloating in your abdomen. °· You have trouble breathing or you are breathing very quickly. °· Your heart is beating very quickly. °· Your skin feels cold and clammy. °· You feel confused. °· You have signs of dehydration, such as: °? Dark urine, very little urine, or no urine. °? Cracked lips. °? Dry mouth. °? Sunken eyes. °? Sleepiness. °? Weakness. °Summary °· Diarrhea is frequent loose and watery bowel movements. Diarrhea can make you feel weak and cause you to become dehydrated. °· Drink enough fluids to keep your urine pale yellow. °· Make sure that you wash your hands after using the toilet. If soap and water are not available, use hand sanitizer. °· Contact a health care provider if your diarrhea gets worse or you have new symptoms. °· Get help right away if you have signs of dehydration. °This   information is not intended to replace advice given to you by your health care provider. Make sure you discuss any questions you have with your health care provider. °Document Revised: 06/03/2018 Document Reviewed: 06/21/2017 °Elsevier Patient Education © 2020 Elsevier Inc. ° °

## 2019-11-28 LAB — BMP8+EGFR
BUN/Creatinine Ratio: 19 (ref 10–24)
BUN: 19 mg/dL (ref 10–36)
CO2: 22 mmol/L (ref 20–29)
Calcium: 8.9 mg/dL (ref 8.6–10.2)
Chloride: 107 mmol/L — ABNORMAL HIGH (ref 96–106)
Creatinine, Ser: 1.02 mg/dL (ref 0.76–1.27)
GFR calc Af Amer: 73 mL/min/{1.73_m2} (ref >59)
GFR calc non Af Amer: 64 mL/min/{1.73_m2} (ref >59)
Glucose: 125 mg/dL — ABNORMAL HIGH (ref 65–99)
Potassium: 4.1 mmol/L (ref 3.5–5.2)
Sodium: 144 mmol/L (ref 134–144)

## 2019-11-28 LAB — CBC WITH DIFFERENTIAL/PLATELET
Basophils Absolute: 0 10*3/uL (ref 0.0–0.2)
Basos: 1 %
EOS (ABSOLUTE): 0.2 10*3/uL (ref 0.0–0.4)
Eos: 4 %
Hematocrit: 37.3 % — ABNORMAL LOW (ref 37.5–51.0)
Hemoglobin: 12.8 g/dL — ABNORMAL LOW (ref 13.0–17.7)
Immature Grans (Abs): 0 10*3/uL (ref 0.0–0.1)
Immature Granulocytes: 0 %
Lymphocytes Absolute: 1.5 10*3/uL (ref 0.7–3.1)
Lymphs: 37 %
MCH: 33.4 pg — ABNORMAL HIGH (ref 26.6–33.0)
MCHC: 34.3 g/dL (ref 31.5–35.7)
MCV: 97 fL (ref 79–97)
Monocytes Absolute: 0.4 10*3/uL (ref 0.1–0.9)
Monocytes: 9 %
Neutrophils Absolute: 2.1 10*3/uL (ref 1.4–7.0)
Neutrophils: 49 %
Platelets: 115 10*3/uL — ABNORMAL LOW (ref 150–450)
RBC: 3.83 x10E6/uL — ABNORMAL LOW (ref 4.14–5.80)
RDW: 12.9 % (ref 11.6–15.4)
WBC: 4.2 10*3/uL (ref 3.4–10.8)

## 2019-11-30 ENCOUNTER — Other Ambulatory Visit: Payer: Self-pay

## 2019-11-30 ENCOUNTER — Other Ambulatory Visit: Payer: Medicare HMO

## 2019-11-30 DIAGNOSIS — R197 Diarrhea, unspecified: Secondary | ICD-10-CM | POA: Diagnosis not present

## 2019-12-08 LAB — CDIFF NAA+O+P+STOOL CULTURE
E coli, Shiga toxin Assay: NEGATIVE
Toxigenic C. Difficile by PCR: NEGATIVE

## 2020-03-06 ENCOUNTER — Other Ambulatory Visit: Payer: Self-pay | Admitting: Family

## 2020-03-06 DIAGNOSIS — I10 Essential (primary) hypertension: Secondary | ICD-10-CM

## 2020-05-19 ENCOUNTER — Other Ambulatory Visit: Payer: Self-pay

## 2020-05-19 ENCOUNTER — Ambulatory Visit (INDEPENDENT_AMBULATORY_CARE_PROVIDER_SITE_OTHER): Payer: Medicare HMO | Admitting: Family Medicine

## 2020-05-19 ENCOUNTER — Encounter: Payer: Self-pay | Admitting: Family Medicine

## 2020-05-19 VITALS — BP 139/84 | HR 84 | Temp 97.5°F | Ht 71.5 in | Wt 228.0 lb

## 2020-05-19 DIAGNOSIS — E119 Type 2 diabetes mellitus without complications: Secondary | ICD-10-CM | POA: Diagnosis not present

## 2020-05-19 DIAGNOSIS — M79661 Pain in right lower leg: Secondary | ICD-10-CM

## 2020-05-19 DIAGNOSIS — J449 Chronic obstructive pulmonary disease, unspecified: Secondary | ICD-10-CM | POA: Diagnosis not present

## 2020-05-19 NOTE — Progress Notes (Signed)
Subjective:  Patient ID: [name], male    DOB: Apr 26, 1924  Age: 87 y.o. MRN: [id number]  CC: pain in calf (Right, started X3 weeks ago, hurts more when walking)   HPI [name] presents for three weeks of right calf pain. Moderately severe. INcreasing over time.  Worse with walking. Feels tight, swollen. No redness.   Depression screen [facility] 2/9 05/19/2020 11/27/2019 11/05/2019  Decreased Interest 0 0 0  Down, Depressed, Hopeless 0 - 0  PHQ - 2 Score 0 0 0    History [name] has a past medical history of Blepharitis, COPD (chronic obstructive pulmonary disease) ([place]), Diabetes mellitus without complication ([place]), Gout, Hearing impaired, Hyperlipidemia, Hypertension, and Thyroid disease.   [name] has a past surgical history that includes Abdominal aortic aneurysm repair; Joint replacement; and Cataract extraction (Left).   His family history includes Healthy in his daughter and daughter; Heart disease in his father; Stroke in his mother.[name] reports that [name] has quit smoking. [name] has never used smokeless tobacco. [name] reports that [name] does not drink alcohol and does not use drugs.    ROS Review of Systems  Constitutional: Negative for fever.  Respiratory: Negative for shortness of breath.   Cardiovascular: Negative for chest pain.  Musculoskeletal: Negative for arthralgias.  Skin: Negative for rash.    Objective:  BP 139/84   Pulse 84   Temp (!) 97.5 F (36.4 C) (Temporal)   Ht 5' 11.5" (1.816 m)   Wt 228 lb (103.4 kg)   SpO2 94%   BMI 31.36 kg/m   BP Readings from Last 3 Encounters:  05/19/20 139/84  11/27/19 (!) 159/73  11/05/19 134/75    Wt Readings from Last 3 Encounters:  05/19/20 228 lb (103.4 kg)  11/27/19 226 lb (102.5 kg)  11/05/19 221 lb 6.4 oz (100.4 kg)     Physical Exam Vitals reviewed.  Constitutional:      Appearance: [name] is well-developed.  HENT:     Head: Normocephalic and atraumatic.     Right Ear: External ear normal.     Left Ear: External ear  normal.     Mouth/Throat:     Pharynx: No oropharyngeal exudate or posterior oropharyngeal erythema.  Eyes:     Pupils: Pupils are equal, round, and reactive to light.  Cardiovascular:     Rate and Rhythm: Normal rate and regular rhythm.     Heart sounds: No murmur heard.   Pulmonary:     Effort: No respiratory distress.     Breath sounds: Normal breath sounds.  Musculoskeletal:        General: Tenderness (right calf) present.     Cervical back: Normal range of motion and neck supple.  Neurological:     Mental Status: [name] is alert and oriented to person, place, and time.       Assessment & Plan:   [name] was seen today for pain in calf.  Diagnoses and all orders for this visit:  Right calf pain -     US Venous Img Lower Unilateral Right; Future  Other orders -     predniSONE (DELTASONE) 20 MG tablet; Take 1 tablet (20 mg total) by mouth 2 (two) times daily with a meal for 7 days.       I am having [name] start on predniSONE. I am also having him maintain his Accu-Chek Aviva Plus, benazepril, amLODipine, pravastatin, levothyroxine, and triamcinolone ointment.  Allergies as of 05/19/2020      Reactions  Penicillins       Medication List       Accurate as of May 19, 2020 11:59 PM. If you have any questions, ask your nurse or doctor.        Accu-Chek Aviva Plus test strip Generic drug: glucose blood Test blood glucose twice daily   amLODipine 5 MG tablet Commonly known as: NORVASC Take 1 tablet (5 mg total) by mouth daily. (Needs to be seen before next refill)   benazepril 40 MG tablet Commonly known as: LOTENSIN Take 1 tablet (40 mg total) by mouth daily.   levothyroxine 100 MCG tablet Commonly known as: SYNTHROID TAKE 1 TABLET EVERY DAY BEFORE BREAKFAST   pravastatin 40 MG tablet Commonly known as: PRAVACHOL Take 1 tablet (40 mg total) by mouth daily.   predniSONE 20 MG tablet Commonly known as: DELTASONE Take 1 tablet (20 mg total) by  mouth 2 (two) times daily with a meal for 7 days. Started by: Claretta Fraise, MD   triamcinolone ointment 0.5 % Commonly known as: KENALOG Apply 1 application topically 2 (two) times daily.        Follow-up: Return if symptoms worsen or fail to improve.  Claretta Fraise, M.D.

## 2020-05-20 ENCOUNTER — Telehealth: Payer: Self-pay | Admitting: Family

## 2020-05-20 ENCOUNTER — Telehealth: Payer: Self-pay

## 2020-05-20 NOTE — Telephone Encounter (Signed)
  Prescription Request  05/20/2020  What is the name of the medication or equipment? Predisone RX  Have you contacted your pharmacy to request a refill? (if applicable) yes  Which pharmacy would you like this sent to? [facility]   Patient notified that their request is being sent to the clinical staff for review and that they should receive a response within 2 business days.  Pt seen Dr Livia Snellen yesterday & he was suppose to call in this med.  Please call daughter.

## 2020-05-23 ENCOUNTER — Ambulatory Visit (HOSPITAL_COMMUNITY): Payer: Medicare HMO

## 2020-05-23 ENCOUNTER — Encounter: Payer: Self-pay | Admitting: Family Medicine

## 2020-05-23 MED ORDER — PREDNISONE 20 MG PO TABS: 20.0000 mg | ORAL_TABLET | Freq: Two times a day (BID) | ORAL | 0 refills | Status: AC

## 2020-05-24 NOTE — Telephone Encounter (Signed)
LMOVM predisone was sent to [facility] on 05/23/20

## 2020-05-27 ENCOUNTER — Ambulatory Visit (HOSPITAL_COMMUNITY)
Admission: RE | Admit: 2020-05-27 | Discharge: 2020-05-27 | Disposition: A | Discharge status: Home / Self Care | Payer: Medicare HMO | Source: Ambulatory Visit | Attending: Family Medicine | Admitting: Family Medicine

## 2020-05-27 ENCOUNTER — Other Ambulatory Visit: Payer: Self-pay | Admitting: Family

## 2020-05-27 DIAGNOSIS — M79661 Pain in right lower leg: Secondary | ICD-10-CM | POA: Diagnosis not present

## 2020-05-27 DIAGNOSIS — E039 Hypothyroidism, unspecified: Secondary | ICD-10-CM

## 2020-05-30 ENCOUNTER — Other Ambulatory Visit: Payer: Self-pay | Admitting: Family

## 2020-05-30 DIAGNOSIS — E1142 Type 2 diabetes mellitus with diabetic polyneuropathy: Secondary | ICD-10-CM

## 2020-07-04 ENCOUNTER — Ambulatory Visit (INDEPENDENT_AMBULATORY_CARE_PROVIDER_SITE_OTHER): Payer: Medicare HMO | Admitting: Family

## 2020-07-04 ENCOUNTER — Encounter: Payer: Self-pay | Admitting: Family

## 2020-07-04 ENCOUNTER — Other Ambulatory Visit: Payer: Self-pay

## 2020-07-04 VITALS — BP 140/75 | HR 73 | Temp 97.1°F | Ht 71.5 in | Wt 227.4 lb

## 2020-07-04 DIAGNOSIS — E785 Hyperlipidemia, unspecified: Secondary | ICD-10-CM

## 2020-07-04 DIAGNOSIS — E1142 Type 2 diabetes mellitus with diabetic polyneuropathy: Secondary | ICD-10-CM

## 2020-07-04 DIAGNOSIS — I10 Essential (primary) hypertension: Secondary | ICD-10-CM | POA: Diagnosis not present

## 2020-07-04 DIAGNOSIS — E1169 Type 2 diabetes mellitus with other specified complication: Secondary | ICD-10-CM

## 2020-07-04 DIAGNOSIS — I771 Stricture of artery: Secondary | ICD-10-CM

## 2020-07-04 DIAGNOSIS — E039 Hypothyroidism, unspecified: Secondary | ICD-10-CM

## 2020-07-04 DIAGNOSIS — I774 Celiac artery compression syndrome: Secondary | ICD-10-CM | POA: Diagnosis not present

## 2020-07-04 MED ORDER — BENAZEPRIL HCL 40 MG PO TABS: 40.0000 mg | ORAL_TABLET | Freq: Every day | ORAL | 1 refills | Status: DC

## 2020-07-04 MED ORDER — ACCU-CHEK AVIVA PLUS VI STRP: ORAL_STRIP | 11 refills | Status: DC

## 2020-07-04 MED ORDER — AMLODIPINE BESYLATE 5 MG PO TABS: 5.0000 mg | ORAL_TABLET | Freq: Every day | ORAL | 1 refills | Status: DC

## 2020-07-04 MED ORDER — LEVOTHYROXINE SODIUM 100 MCG PO TABS: ORAL_TABLET | ORAL | 1 refills | Status: DC

## 2020-07-04 MED ORDER — PRAVASTATIN SODIUM 40 MG PO TABS: 40.0000 mg | ORAL_TABLET | Freq: Every day | ORAL | 1 refills | Status: DC

## 2020-07-04 NOTE — Patient Instructions (Signed)
Health Maintenance After Age 85 After age 85, you are at a higher risk for certain long-term diseases and infections as well as injuries from falls. Falls are a major cause of broken bones and head injuries in people who are older than age 85. Getting regular preventive care can help to keep you healthy and well. Preventive care includes getting regular testing and making lifestyle changes as recommended by your health care provider. Talk with your health care provider about:  Which screenings and tests you should have. A screening is a test that checks for a disease when you have no symptoms.  A diet and exercise plan that is right for you. What should I know about screenings and tests to prevent falls? Screening and testing are the best ways to find a health problem early. Early diagnosis and treatment give you the best chance of managing medical conditions that are common after age 85. Certain conditions and lifestyle choices may make you more likely to have a fall. Your health care provider may recommend:  Regular vision checks. Poor vision and conditions such as cataracts can make you more likely to have a fall. If you wear glasses, make sure to get your prescription updated if your vision changes.  Medicine review. Work with your health care provider to regularly review all of the medicines you are taking, including over-the-counter medicines. Ask your health care provider about any side effects that may make you more likely to have a fall. Tell your health care provider if any medicines that you take make you feel dizzy or sleepy.  Osteoporosis screening. Osteoporosis is a condition that causes the bones to get weaker. This can make the bones weak and cause them to break more easily.  Blood pressure screening. Blood pressure changes and medicines to control blood pressure can make you feel dizzy.  Strength and balance checks. Your health care provider may recommend certain tests to check your  strength and balance while standing, walking, or changing positions.  Foot health exam. Foot pain and numbness, as well as not wearing proper footwear, can make you more likely to have a fall.  Depression screening. You may be more likely to have a fall if you have a fear of falling, feel emotionally low, or feel unable to do activities that you used to do.  Alcohol use screening. Using too much alcohol can affect your balance and may make you more likely to have a fall. What actions can I take to lower my risk of falls? General instructions  Talk with your health care provider about your risks for falling. Tell your health care provider if: ? You fall. Be sure to tell your health care provider about all falls, even ones that seem minor. ? You feel dizzy, sleepy, or off-balance.  Take over-the-counter and prescription medicines only as told by your health care provider. These include any supplements.  Eat a healthy diet and maintain a healthy weight. A healthy diet includes low-fat dairy products, low-fat (lean) meats, and fiber from whole grains, beans, and lots of fruits and vegetables. Home safety  Remove any tripping hazards, such as rugs, cords, and clutter.  Install safety equipment such as grab bars in bathrooms and safety rails on stairs.  Keep rooms and walkways well-lit. Activity  Follow a regular exercise program to stay fit. This will help you maintain your balance. Ask your health care provider what types of exercise are appropriate for you.  If you need a cane or walker,   use it as recommended by your health care provider.  Wear supportive shoes that have nonskid soles.   Lifestyle  Do not drink alcohol if your health care provider tells you not to drink.  If you drink alcohol, limit how much you have: ? 0-1 drink a day for women. ? 0-2 drinks a day for men.  Be aware of how much alcohol is in your drink. In the [place] one drink equals one typical bottle of beer (12  oz), one-half glass of wine (5 oz), or one shot of hard liquor (1 oz).  Do not use any products that contain nicotine or tobacco, such as cigarettes and e-cigarettes. If you need help quitting, ask your health care provider. Summary  Having a healthy lifestyle and getting preventive care can help to protect your health and wellness after age 85.  Screening and testing are the best way to find a health problem early and help you avoid having a fall. Early diagnosis and treatment give you the best chance for managing medical conditions that are more common for people who are older than age 85.  Falls are a major cause of broken bones and head injuries in people who are older than age 85. Take precautions to prevent a fall at home.  Work with your health care provider to learn what changes you can make to improve your health and wellness and to prevent falls. This information is not intended to replace advice given to you by your health care provider. Make sure you discuss any questions you have with your health care provider. Document Revised: 05/08/2018 Document Reviewed: 11/28/2016 Elsevier Patient Education  2021 Elsevier Inc.  

## 2020-07-04 NOTE — Progress Notes (Signed)
Subjective:    Patient ID: [name], male    DOB: [birth year], 87 y.o.   MRN: [id number]  Chief Complaint  Patient presents with  . Medical Management of Chronic Issues    PT presents to the office today for chronic follow up. Hypertension This is a chronic problem. The current episode started more than 1 year ago. The problem has been resolved since onset. The problem is controlled. Pertinent negatives include no blurred vision, malaise/fatigue or peripheral edema. Risk factors for coronary artery disease include dyslipidemia, obesity and sedentary lifestyle. The current treatment provides moderate improvement. Identifiable causes of hypertension include a thyroid problem.  Thyroid Problem Presents for follow-up visit. Symptoms include constipation and fatigue. Patient reports no anxiety or cold intolerance. The symptoms have been stable. His past medical history is significant for hyperlipidemia.  Hyperlipidemia This is a chronic problem. The current episode started more than 1 year ago. The problem is controlled. Recent lipid tests were reviewed and are normal. Exacerbating diseases include hypothyroidism. Current antihyperlipidemic treatment includes statins. The current treatment provides moderate improvement of lipids.  Diabetes He presents for his follow-up diabetic visit. He has type 2 diabetes mellitus. His disease course has been stable. Pertinent negatives for hypoglycemia include no nervousness/anxiousness. Associated symptoms include fatigue. Pertinent negatives for diabetes include no blurred vision. Symptoms are stable. Risk factors for coronary artery disease include dyslipidemia, diabetes mellitus, male sex, hypertension and sedentary lifestyle. He is following a generally unhealthy diet. His overall blood glucose range is 110-130 mg/dl. An ACE inhibitor/angiotensin II receptor blocker is being taken.  Arthritis Presents for follow-up visit. He complains of pain and stiffness.  The symptoms have been stable. Affected locations include the right knee, left knee, left hip, right hip, left MCP and right MCP. His pain is at a severity of 6/10. Associated symptoms include fatigue.      Review of Systems  Constitutional: Positive for fatigue. Negative for malaise/fatigue.  Eyes: Negative for blurred vision.  Gastrointestinal: Positive for constipation.  Endocrine: Negative for cold intolerance.  Musculoskeletal: Positive for arthritis and stiffness.  Psychiatric/Behavioral: The patient is not nervous/anxious.   All other systems reviewed and are negative.      Objective:   Physical Exam Vitals reviewed.  Constitutional:      General: He is not in acute distress.    Appearance: He is well-developed.  HENT:     Head: Normocephalic.     Right Ear: Tympanic membrane normal.     Left Ear: Tympanic membrane normal.  Eyes:     General:        Right eye: No discharge.        Left eye: No discharge.     Pupils: Pupils are equal, round, and reactive to light.  Neck:     Thyroid: No thyromegaly.  Cardiovascular:     Rate and Rhythm: Normal rate and regular rhythm.     Heart sounds: Murmur heard.    Pulmonary:     Effort: Pulmonary effort is normal. No respiratory distress.     Breath sounds: Normal breath sounds. No wheezing.  Abdominal:     General: Bowel sounds are normal. There is no distension.     Palpations: Abdomen is soft.     Tenderness: There is no abdominal tenderness.  Musculoskeletal:        General: No tenderness. Normal range of motion.     Cervical back: Normal range of motion and neck supple.  Skin:  General: Skin is warm and dry.     Findings: No erythema or rash.  Neurological:     Mental Status: He is alert and oriented to person, place, and time.     Cranial Nerves: No cranial nerve deficit.     Deep Tendon Reflexes: Reflexes are normal and symmetric.  Psychiatric:        Behavior: Behavior normal.        Thought Content:  Thought content normal.        Judgment: Judgment normal.       BP 140/75   Pulse 73   Temp (!) 97.1 F (36.2 C) (Temporal)   Ht 5' 11.5" (1.816 m)   Wt 227 lb 6.4 oz (103.1 kg)   SpO2 93%   BMI 31.27 kg/m      Assessment & Plan:  ADLEY MAZUROWSKI comes in today with chief complaint of Medical Management of Chronic Issues   Diagnosis and orders addressed:  1. Celiac artery stenosis (HCC) - CMP14+EGFR - CBC with Differential/Platelet  2. Essential hypertension - benazepril (LOTENSIN) 40 MG tablet; Take 1 tablet (40 mg total) by mouth daily.  Dispense: 90 tablet; Refill: 1 - amLODipine (NORVASC) 5 MG tablet; Take 1 tablet (5 mg total) by mouth daily.  Dispense: 90 tablet; Refill: 1 - CMP14+EGFR - CBC with Differential/Platelet  3. Hyperlipidemia associated with type 2 diabetes mellitus (HCC) - pravastatin (PRAVACHOL) 40 MG tablet; Take 1 tablet (40 mg total) by mouth daily.  Dispense: 90 tablet; Refill: 1 - CMP14+EGFR - CBC with Differential/Platelet  4. Hypothyroidism, unspecified type - levothyroxine (SYNTHROID) 100 MCG tablet; TAKE 1 TABLET EVERY DAY BEFORE BREAKFAST  Dispense: 90 tablet; Refill: 1 - CMP14+EGFR - CBC with Differential/Platelet - TSH  5. Type 2 diabetes mellitus with diabetic polyneuropathy, without long-term current use of insulin (HCC)  - glucose blood (ACCU-CHEK AVIVA PLUS) test strip; Test blood glucose twice daily Dx E11.9  Dispense: 200 strip; Refill: 11 - CMP14+EGFR - CBC with Differential/Platelet   Labs pending Health Maintenance reviewed Diet and exercise encouraged  Follow up plan: 6 months    Evelina Dun, FNP

## 2020-07-05 LAB — CBC WITH DIFFERENTIAL/PLATELET
Basophils Absolute: 0 10*3/uL (ref 0.0–0.2)
Basos: 0 %
EOS (ABSOLUTE): 0.1 10*3/uL (ref 0.0–0.4)
Eos: 2 %
Hematocrit: 39.6 % (ref 37.5–51.0)
Hemoglobin: 13.8 g/dL (ref 13.0–17.7)
Immature Grans (Abs): 0 10*3/uL (ref 0.0–0.1)
Immature Granulocytes: 1 %
Lymphocytes Absolute: 1.8 10*3/uL (ref 0.7–3.1)
Lymphs: 41 %
MCH: 33.8 pg — ABNORMAL HIGH (ref 26.6–33.0)
MCHC: 34.8 g/dL (ref 31.5–35.7)
MCV: 97 fL (ref 79–97)
Monocytes Absolute: 0.4 10*3/uL (ref 0.1–0.9)
Monocytes: 9 %
Neutrophils Absolute: 2 10*3/uL (ref 1.4–7.0)
Neutrophils: 47 %
Platelets: 112 10*3/uL — ABNORMAL LOW (ref 150–450)
RBC: 4.08 x10E6/uL — ABNORMAL LOW (ref 4.14–5.80)
RDW: 13 % (ref 11.6–15.4)
WBC: 4.3 10*3/uL (ref 3.4–10.8)

## 2020-07-05 LAB — CMP14+EGFR
ALT: 12 IU/L (ref 0–44)
AST: 14 IU/L (ref 0–40)
Albumin/Globulin Ratio: 1.7 (ref 1.2–2.2)
Albumin: 4.1 g/dL (ref 3.5–4.6)
Alkaline Phosphatase: 73 IU/L (ref 44–121)
BUN/Creatinine Ratio: 15 (ref 10–24)
BUN: 16 mg/dL (ref 10–36)
Bilirubin Total: 0.5 mg/dL (ref 0.0–1.2)
CO2: 21 mmol/L (ref 20–29)
Calcium: 9.1 mg/dL (ref 8.6–10.2)
Chloride: 104 mmol/L (ref 96–106)
Creatinine, Ser: 1.09 mg/dL (ref 0.76–1.27)
Globulin, Total: 2.4 g/dL (ref 1.5–4.5)
Glucose: 133 mg/dL — ABNORMAL HIGH (ref 65–99)
Potassium: 4 mmol/L (ref 3.5–5.2)
Sodium: 141 mmol/L (ref 134–144)
Total Protein: 6.5 g/dL (ref 6.0–8.5)
eGFR: 63 mL/min/{1.73_m2} (ref >59)

## 2020-07-05 LAB — TSH: TSH: 1.77 u[IU]/mL (ref 0.450–4.500)

## 2020-10-28 DIAGNOSIS — H2513 Age-related nuclear cataract, bilateral: Secondary | ICD-10-CM | POA: Diagnosis not present

## 2020-10-28 DIAGNOSIS — H40033 Anatomical narrow angle, bilateral: Secondary | ICD-10-CM | POA: Diagnosis not present

## 2021-01-03 ENCOUNTER — Other Ambulatory Visit: Payer: Self-pay | Admitting: Family

## 2021-01-03 DIAGNOSIS — I10 Essential (primary) hypertension: Secondary | ICD-10-CM

## 2021-01-03 DIAGNOSIS — E1169 Type 2 diabetes mellitus with other specified complication: Secondary | ICD-10-CM

## 2021-01-03 DIAGNOSIS — E785 Hyperlipidemia, unspecified: Secondary | ICD-10-CM

## 2021-01-12 DIAGNOSIS — Z85820 Personal history of malignant melanoma of skin: Secondary | ICD-10-CM | POA: Diagnosis not present

## 2021-01-12 DIAGNOSIS — Z1283 Encounter for screening for malignant neoplasm of skin: Secondary | ICD-10-CM | POA: Diagnosis not present

## 2021-01-12 DIAGNOSIS — D225 Melanocytic nevi of trunk: Secondary | ICD-10-CM | POA: Diagnosis not present

## 2021-01-12 DIAGNOSIS — Z08 Encounter for follow-up examination after completed treatment for malignant neoplasm: Secondary | ICD-10-CM | POA: Diagnosis not present

## 2021-01-24 ENCOUNTER — Encounter: Payer: Self-pay | Admitting: Family

## 2021-01-24 ENCOUNTER — Ambulatory Visit (INDEPENDENT_AMBULATORY_CARE_PROVIDER_SITE_OTHER): Payer: Medicare HMO | Admitting: Family

## 2021-01-24 VITALS — BP 127/69 | HR 60 | Temp 96.9°F | Ht 71.5 in | Wt 222.0 lb

## 2021-01-24 DIAGNOSIS — E785 Hyperlipidemia, unspecified: Secondary | ICD-10-CM | POA: Diagnosis not present

## 2021-01-24 DIAGNOSIS — J449 Chronic obstructive pulmonary disease, unspecified: Secondary | ICD-10-CM | POA: Diagnosis not present

## 2021-01-24 DIAGNOSIS — E1169 Type 2 diabetes mellitus with other specified complication: Secondary | ICD-10-CM

## 2021-01-24 DIAGNOSIS — I10 Essential (primary) hypertension: Secondary | ICD-10-CM | POA: Diagnosis not present

## 2021-01-24 DIAGNOSIS — M1990 Unspecified osteoarthritis, unspecified site: Secondary | ICD-10-CM | POA: Diagnosis not present

## 2021-01-24 DIAGNOSIS — E039 Hypothyroidism, unspecified: Secondary | ICD-10-CM | POA: Diagnosis not present

## 2021-01-24 DIAGNOSIS — E669 Obesity, unspecified: Secondary | ICD-10-CM | POA: Diagnosis not present

## 2021-01-24 DIAGNOSIS — Z23 Encounter for immunization: Secondary | ICD-10-CM

## 2021-01-24 LAB — CMP14+EGFR
ALT: 8 IU/L (ref 0–44)
AST: 17 IU/L (ref 0–40)
Albumin/Globulin Ratio: 1.8 (ref 1.2–2.2)
Albumin: 4.4 g/dL (ref 3.5–4.6)
Alkaline Phosphatase: 75 IU/L (ref 44–121)
BUN/Creatinine Ratio: 22 (ref 10–24)
BUN: 25 mg/dL (ref 10–36)
Bilirubin Total: 0.8 mg/dL (ref 0.0–1.2)
CO2: 23 mmol/L (ref 20–29)
Calcium: 9.6 mg/dL (ref 8.6–10.2)
Chloride: 103 mmol/L (ref 96–106)
Creatinine, Ser: 1.16 mg/dL (ref 0.76–1.27)
Globulin, Total: 2.4 g/dL (ref 1.5–4.5)
Glucose: 123 mg/dL — ABNORMAL HIGH (ref 70–99)
Potassium: 4 mmol/L (ref 3.5–5.2)
Sodium: 141 mmol/L (ref 134–144)
Total Protein: 6.8 g/dL (ref 6.0–8.5)
eGFR: 58 mL/min/{1.73_m2} — ABNORMAL LOW (ref >59)

## 2021-01-24 LAB — BAYER DCA HB A1C WAIVED: HB A1C (BAYER DCA - WAIVED): 6.3 % — ABNORMAL HIGH (ref 4.8–5.6)

## 2021-01-24 MED ORDER — BENAZEPRIL HCL 40 MG PO TABS
40.0000 mg | ORAL_TABLET | Freq: Every day | ORAL | 1 refills | Status: DC
Start: 2021-01-24 — End: 2021-07-27

## 2021-01-24 MED ORDER — AMLODIPINE BESYLATE 5 MG PO TABS: 5.0000 mg | ORAL_TABLET | Freq: Every day | ORAL | 1 refills | Status: DC

## 2021-01-24 MED ORDER — PRAVASTATIN SODIUM 40 MG PO TABS: 40.0000 mg | ORAL_TABLET | Freq: Every day | ORAL | 1 refills | Status: DC

## 2021-01-24 MED ORDER — LEVOTHYROXINE SODIUM 100 MCG PO TABS: ORAL_TABLET | ORAL | 1 refills | Status: DC

## 2021-01-24 NOTE — Patient Instructions (Signed)
Health Maintenance After Age 85 After age 85, you are at a higher risk for certain long-term diseases and infections as well as injuries from falls. Falls are a major cause of broken bones and head injuries in people who are older than age 85. Getting regular preventive care can help to keep you healthy and well. Preventive care includes getting regular testing and making lifestyle changes as recommended by your health care provider. Talk with your health care provider about: Which screenings and tests you should have. A screening is a test that checks for a disease when you have no symptoms. A diet and exercise plan that is right for you. What should I know about screenings and tests to prevent falls? Screening and testing are the best ways to find a health problem early. Early diagnosis and treatment give you the best chance of managing medical conditions that are common after age 85. Certain conditions and lifestyle choices may make you more likely to have a fall. Your health care provider may recommend: Regular vision checks. Poor vision and conditions such as cataracts can make you more likely to have a fall. If you wear glasses, make sure to get your prescription updated if your vision changes. Medicine review. Work with your health care provider to regularly review all of the medicines you are taking, including over-the-counter medicines. Ask your health care provider about any side effects that may make you more likely to have a fall. Tell your health care provider if any medicines that you take make you feel dizzy or sleepy. Strength and balance checks. Your health care provider may recommend certain tests to check your strength and balance while standing, walking, or changing positions. Foot health exam. Foot pain and numbness, as well as not wearing proper footwear, can make you more likely to have a fall. Screenings, including: Osteoporosis screening. Osteoporosis is a condition that causes  the bones to get weaker and break more easily. Blood pressure screening. Blood pressure changes and medicines to control blood pressure can make you feel dizzy. Depression screening. You may be more likely to have a fall if you have a fear of falling, feel depressed, or feel unable to do activities that you used to do. Alcohol use screening. Using too much alcohol can affect your balance and may make you more likely to have a fall. Follow these instructions at home: Lifestyle Do not drink alcohol if: Your health care provider tells you not to drink. If you drink alcohol: Limit how much you have to: 0-1 drink a day for women. 0-2 drinks a day for men. Know how much alcohol is in your drink. In the [place] one drink equals one 12 oz bottle of beer (355 mL), one 5 oz glass of wine (148 mL), or one 1 oz glass of hard liquor (44 mL). Do not use any products that contain nicotine or tobacco. These products include cigarettes, chewing tobacco, and vaping devices, such as e-cigarettes. If you need help quitting, ask your health care provider. Activity  Follow a regular exercise program to stay fit. This will help you maintain your balance. Ask your health care provider what types of exercise are appropriate for you. If you need a cane or walker, use it as recommended by your health care provider. Wear supportive shoes that have nonskid soles. Safety  Remove any tripping hazards, such as rugs, cords, and clutter. Install safety equipment such as grab bars in bathrooms and safety rails on stairs. Keep rooms and walkways   well-lit. General instructions Talk with your health care provider about your risks for falling. Tell your health care provider if: You fall. Be sure to tell your health care provider about all falls, even ones that seem minor. You feel dizzy, tiredness (fatigue), or off-balance. Take over-the-counter and prescription medicines only as told by your health care provider. These include  supplements. Eat a healthy diet and maintain a healthy weight. A healthy diet includes low-fat dairy products, low-fat (lean) meats, and fiber from whole grains, beans, and lots of fruits and vegetables. Stay current with your vaccines. Schedule regular health, dental, and eye exams. Summary Having a healthy lifestyle and getting preventive care can help to protect your health and wellness after age 85. Screening and testing are the best way to find a health problem early and help you avoid having a fall. Early diagnosis and treatment give you the best chance for managing medical conditions that are more common for people who are older than age 85. Falls are a major cause of broken bones and head injuries in people who are older than age 85. Take precautions to prevent a fall at home. Work with your health care provider to learn what changes you can make to improve your health and wellness and to prevent falls. This information is not intended to replace advice given to you by your health care provider. Make sure you discuss any questions you have with your health care provider. Document Revised: 06/06/2020 Document Reviewed: 06/06/2020 Elsevier Patient Education  2022 [employer]  

## 2021-01-24 NOTE — Progress Notes (Signed)
Subjective:    Patient ID: [name], male    DOB: 1924-11-18, 87 y.o.   MRN: [id number]  Chief Complaint  Patient presents with   Medical Management of Chronic Issues   PT presents to the office today for chronic follow up. He has COPD and states he has SOB at times. He quit smoking in 1983.  Hypertension This is a chronic problem. The current episode started more than 1 year ago. The problem has been waxing and waning since onset. The problem is uncontrolled. Associated symptoms include shortness of breath. Pertinent negatives include no blurred vision, malaise/fatigue or peripheral edema. Risk factors for coronary artery disease include dyslipidemia, obesity and male gender. The current treatment provides moderate improvement. Identifiable causes of hypertension include a thyroid problem.  Thyroid Problem Presents for follow-up visit. Symptoms include dry skin. Patient reports no anxiety, depressed mood or fatigue. The symptoms have been stable. His past medical history is significant for hyperlipidemia.  Hyperlipidemia This is a chronic problem. The current episode started more than 1 year ago. The problem is controlled. Exacerbating diseases include obesity. Associated symptoms include shortness of breath. Current antihyperlipidemic treatment includes statins. The current treatment provides moderate improvement of lipids. Risk factors for coronary artery disease include dyslipidemia, hypertension, male sex and a sedentary lifestyle.  Arthritis Presents for follow-up visit. He complains of pain and stiffness. The symptoms have been stable. Affected locations include the left hip, right hip, left knee and right knee. His pain is at a severity of 2/10. Pertinent negatives include no fatigue.  Diabetes He presents for his follow-up diabetic visit. He has type 2 diabetes mellitus. Pertinent negatives for hypoglycemia include no nervousness/anxiousness. Pertinent negatives for diabetes include no  blurred vision, no fatigue and no foot paresthesias. Symptoms are stable. Diabetic complications include heart disease. Pertinent negatives for diabetic complications include no peripheral neuropathy. Risk factors for coronary artery disease include dyslipidemia, diabetes mellitus, male sex, hypertension and sedentary lifestyle. He is following a generally healthy diet. His overall blood glucose range is 110-130 mg/dl.     Review of Systems  Constitutional:  Negative for fatigue and malaise/fatigue.  Eyes:  Negative for blurred vision.  Respiratory:  Positive for shortness of breath.   Musculoskeletal:  Positive for arthritis and stiffness.  Psychiatric/Behavioral:  The patient is not nervous/anxious.   All other systems reviewed and are negative.     Objective:   Physical Exam Vitals reviewed.  Constitutional:      General: He is not in acute distress.    Appearance: He is well-developed. He is obese.  HENT:     Head: Normocephalic.     Right Ear: Tympanic membrane normal.     Left Ear: Tympanic membrane normal.  Eyes:     General:        Right eye: No discharge.        Left eye: No discharge.     Pupils: Pupils are equal, round, and reactive to light.  Neck:     Thyroid: No thyromegaly.  Cardiovascular:     Rate and Rhythm: Normal rate and regular rhythm.     Heart sounds: Normal heart sounds. No murmur heard. Pulmonary:     Effort: Pulmonary effort is normal. No respiratory distress.     Breath sounds: Normal breath sounds. No wheezing.  Abdominal:     General: Bowel sounds are normal. There is no distension.     Palpations: Abdomen is soft.     Tenderness: There is  no abdominal tenderness.  Musculoskeletal:        General: No tenderness. Normal range of motion.     Cervical back: Normal range of motion and neck supple.  Skin:    General: Skin is warm and dry.     Findings: No erythema or rash.  Neurological:     Mental Status: He is alert and oriented to person,  place, and time.     Cranial Nerves: No cranial nerve deficit.     Deep Tendon Reflexes: Reflexes are normal and symmetric.  Psychiatric:        Behavior: Behavior normal.        Thought Content: Thought content normal.        Judgment: Judgment normal.     BP (!) 176/76    Pulse 60    Temp (!) 96.9 F (36.1 C) (Temporal)    Ht 5' 11.5" (1.816 m)    Wt 222 lb (100.7 kg)    BMI 30.53 kg/m      Assessment & Plan:  [name] comes in today with chief complaint of Medical Management of Chronic Issues   Diagnosis and orders addressed:  1. Essential hypertension - amLODipine (NORVASC) 5 MG tablet; Take 1 tablet (5 mg total) by mouth daily.  Dispense: 90 tablet; Refill: 1 - benazepril (LOTENSIN) 40 MG tablet; Take 1 tablet (40 mg total) by mouth daily.  Dispense: 90 tablet; Refill: 1 - CMP14+EGFR  2. Hyperlipidemia associated with type 2 diabetes mellitus (HCC) - pravastatin (PRAVACHOL) 40 MG tablet; Take 1 tablet (40 mg total) by mouth daily.  Dispense: 90 tablet; Refill: 1 - CMP14+EGFR  3. Hypothyroidism, unspecified type - levothyroxine (SYNTHROID) 100 MCG tablet; TAKE 1 TABLET EVERY DAY BEFORE BREAKFAST  Dispense: 90 tablet; Refill: 1 - CMP14+EGFR  4. Need for immunization against influenza - Flu Vaccine QUAD High Dose(Fluad) - CMP14+EGFR  5. Chronic obstructive pulmonary disease, unspecified COPD type ([place]) - CMP14+EGFR  6. Type 2 diabetes mellitus with other specified complication, without long-term current use of insulin (HCC) - CMP14+EGFR - Bayer DCA Hb A1c Waived  7. Arthritis - CMP14+EGFR  8. Obesity (BMI 30-39.9) - CMP14+EGFR   Labs pending Health Maintenance reviewed Diet and exercise encouraged  Follow up plan: 6 months    Evelina Dun, FNP

## 2021-03-20 DIAGNOSIS — K121 Other forms of stomatitis: Secondary | ICD-10-CM | POA: Diagnosis not present

## 2021-03-20 DIAGNOSIS — K146 Glossodynia: Secondary | ICD-10-CM | POA: Diagnosis not present

## 2021-03-20 DIAGNOSIS — K120 Recurrent oral aphthae: Secondary | ICD-10-CM | POA: Diagnosis not present

## 2021-07-04 IMAGING — US US EXTREM LOW VENOUS*R*
1 series · 14 of 24 positions shown · non-contrast
Comparison: None.

CLINICAL DATA: Calf pain.

EXAM:
RIGHT LOWER EXTREMITY VENOUS DOPPLER ULTRASOUND
TECHNIQUE: Gray-scale sonography with compression, as well as color and duplex
ultrasound, were performed to evaluate the deep venous system(s)
from the level of the common femoral vein through the popliteal and
proximal calf veins.

[Series 1: us extrem low venous*right* · 0.08mm/px · 14 of 39 slices shown]
[im 1/39]
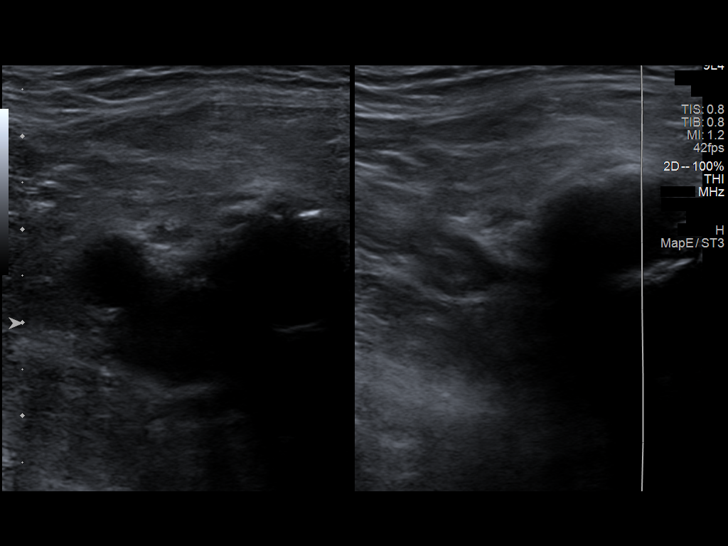
[im 4/39]
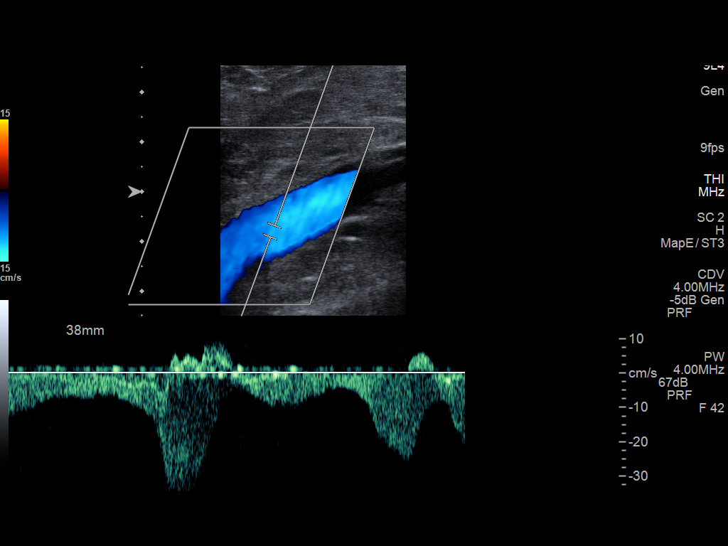
[im 7/39]
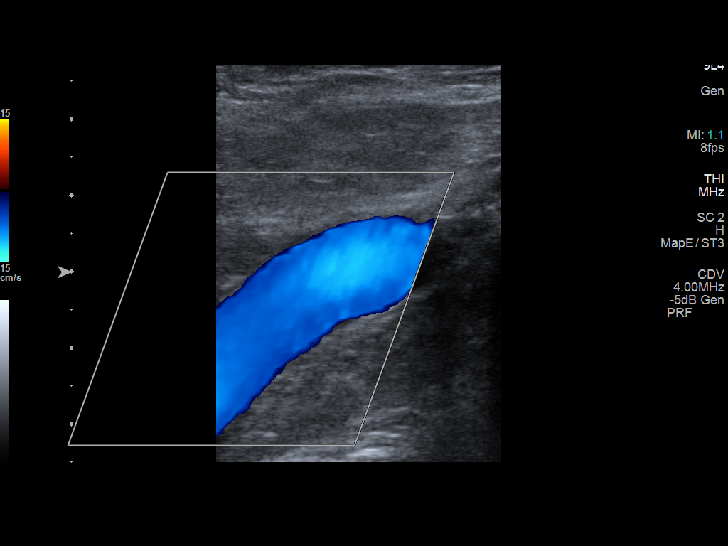
[im 10/39]
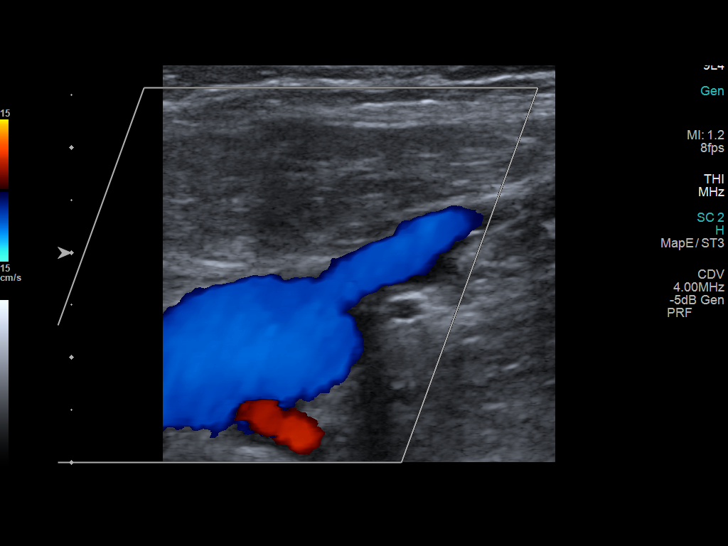
[im 12/39]
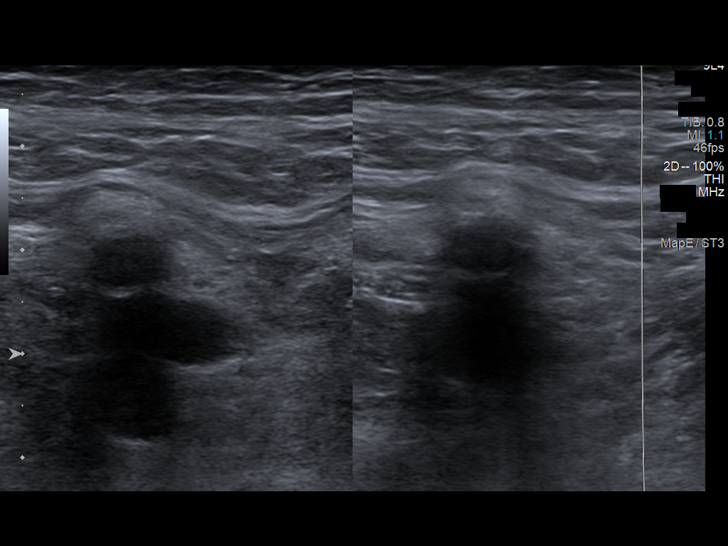
[im 15/39]
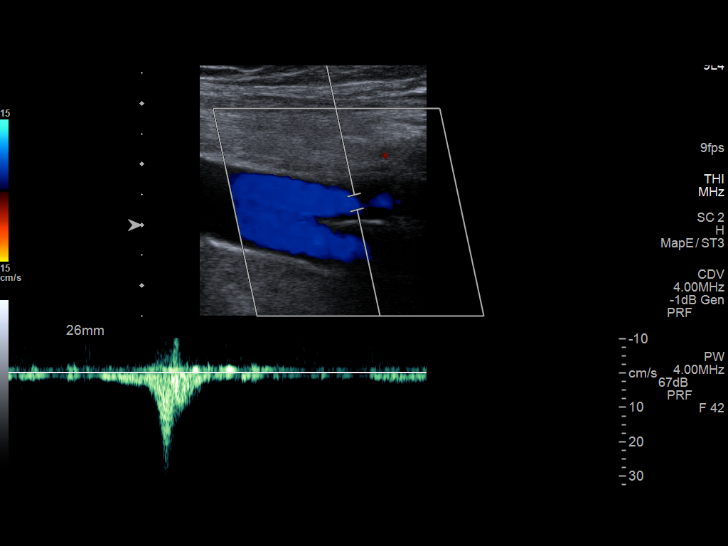
[im 19/39]
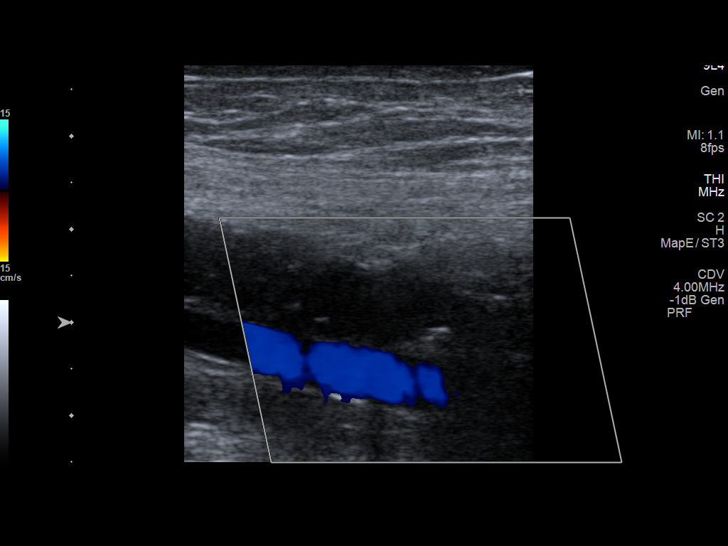
[im 20/39]
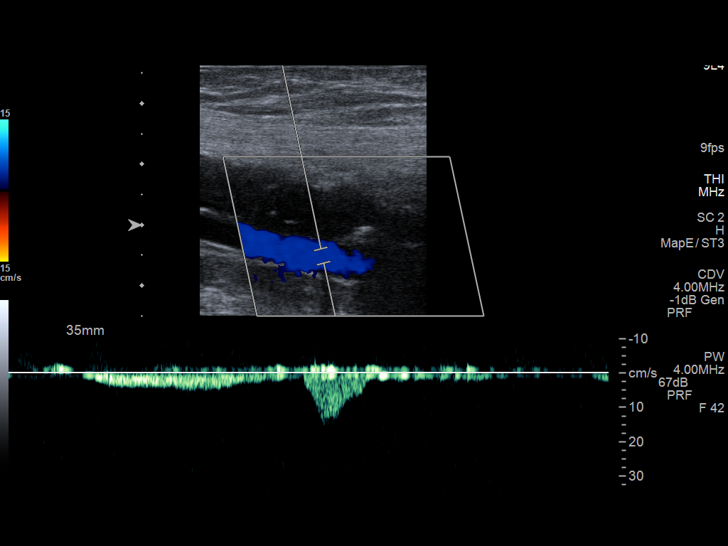
[im 24/39]
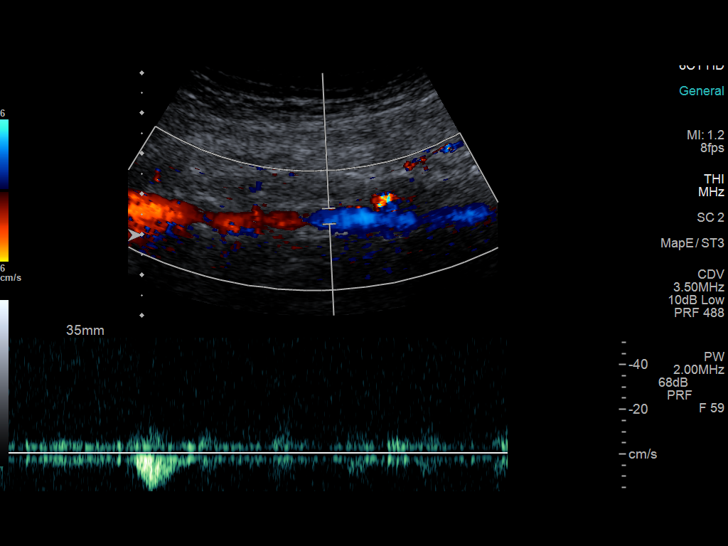
[im 27/39]
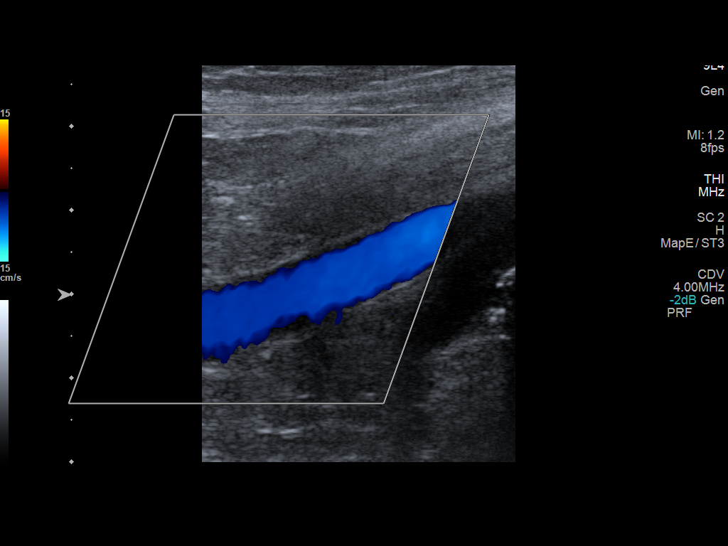
[im 30/39]
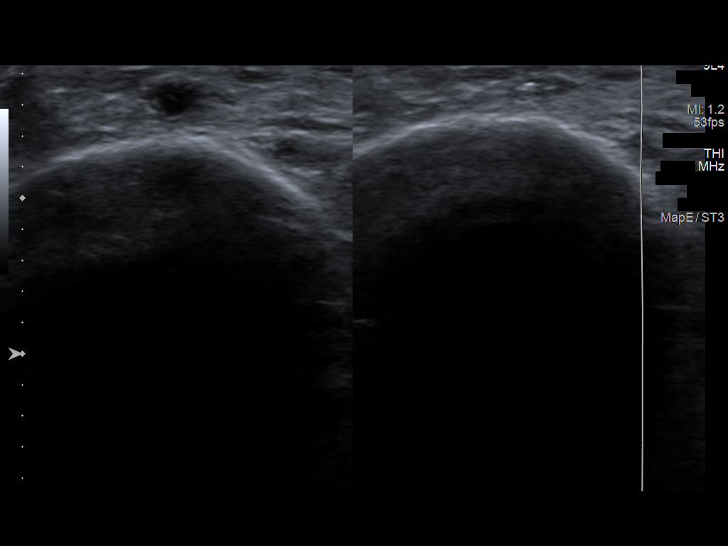
[im 32/39]
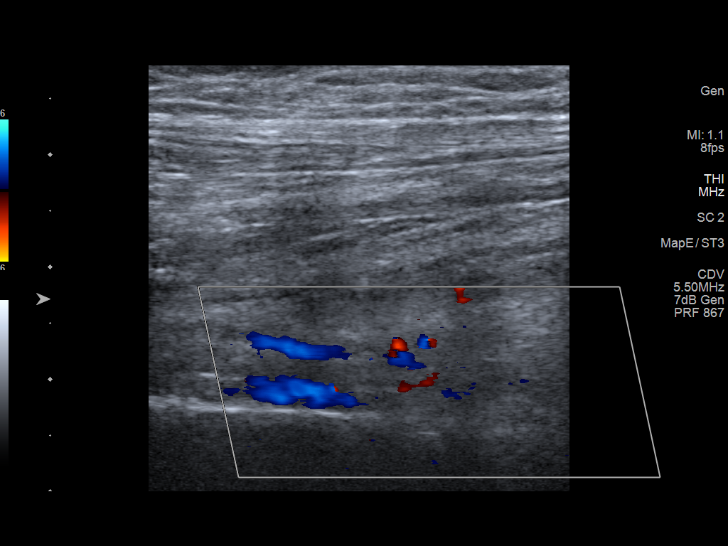
[im 35/39]
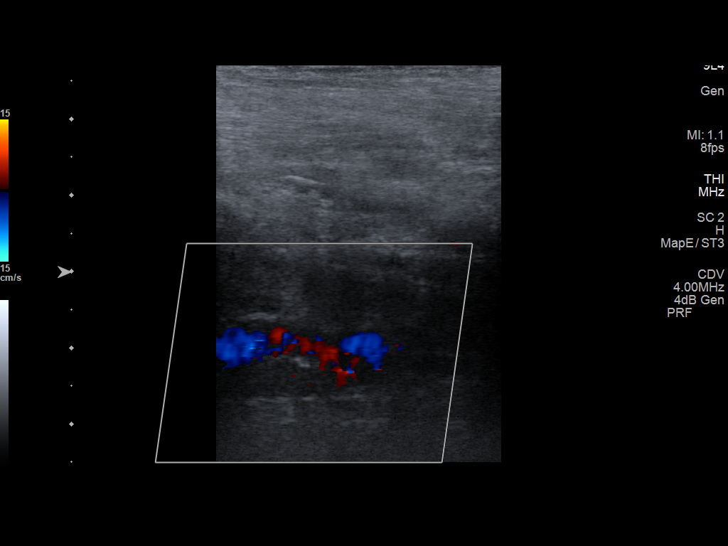
[im 39/39]
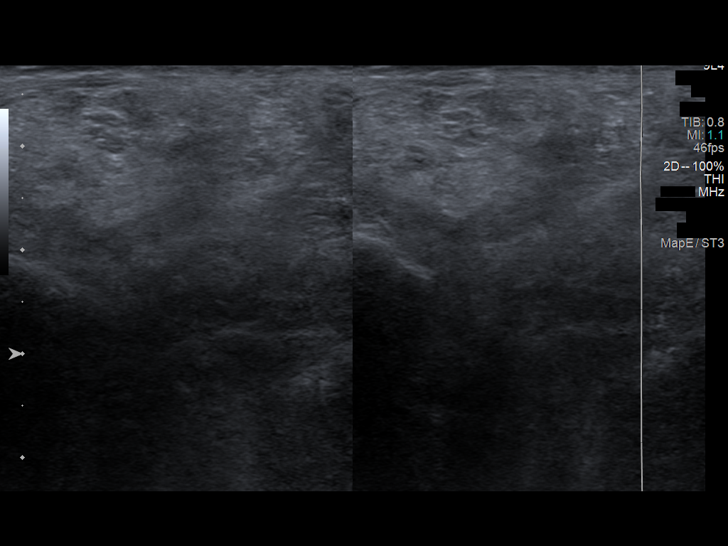

[14 of 24 positions shown; findings below may reference images not displayed]

FINDINGS: VENOUS

Normal compressibility of the common femoral, superficial femoral,
and popliteal veins, as well as the visualized calf veins.
Visualized portions of profunda femoral vein and great saphenous
vein unremarkable. No filling defects to suggest DVT on grayscale or
color Doppler imaging. Doppler waveforms show normal direction of
venous flow, normal respiratory plasticity and response to
augmentation.

Limited views of the contralateral common femoral vein are
unremarkable.

Limitations: Limited visualization of the veins in the lower leg,
particularly the calf veins.
IMPRESSION: No evidence of DVT. Limited visualization of the veins in the lower
leg, particularly the calf veins.

## 2021-07-27 ENCOUNTER — Other Ambulatory Visit: Payer: Self-pay | Admitting: Family

## 2021-07-27 DIAGNOSIS — E1142 Type 2 diabetes mellitus with diabetic polyneuropathy: Secondary | ICD-10-CM

## 2021-07-27 DIAGNOSIS — E039 Hypothyroidism, unspecified: Secondary | ICD-10-CM

## 2021-07-27 DIAGNOSIS — E1169 Type 2 diabetes mellitus with other specified complication: Secondary | ICD-10-CM

## 2021-07-27 DIAGNOSIS — I10 Essential (primary) hypertension: Secondary | ICD-10-CM

## 2021-07-28 DIAGNOSIS — X501XXA Overexertion from prolonged static or awkward postures, initial encounter: Secondary | ICD-10-CM | POA: Diagnosis not present

## 2021-07-28 DIAGNOSIS — M4316 Spondylolisthesis, lumbar region: Secondary | ICD-10-CM | POA: Diagnosis not present

## 2021-07-28 DIAGNOSIS — S32010A Wedge compression fracture of first lumbar vertebra, initial encounter for closed fracture: Secondary | ICD-10-CM | POA: Diagnosis not present

## 2021-07-28 DIAGNOSIS — M47816 Spondylosis without myelopathy or radiculopathy, lumbar region: Secondary | ICD-10-CM | POA: Diagnosis not present

## 2021-07-28 DIAGNOSIS — S39012A Strain of muscle, fascia and tendon of lower back, initial encounter: Secondary | ICD-10-CM | POA: Diagnosis not present

## 2021-07-28 DIAGNOSIS — E079 Disorder of thyroid, unspecified: Secondary | ICD-10-CM | POA: Diagnosis not present

## 2021-07-28 DIAGNOSIS — M4699 Unspecified inflammatory spondylopathy, multiple sites in spine: Secondary | ICD-10-CM | POA: Diagnosis not present

## 2021-07-28 DIAGNOSIS — I10 Essential (primary) hypertension: Secondary | ICD-10-CM | POA: Diagnosis not present

## 2021-07-28 DIAGNOSIS — E785 Hyperlipidemia, unspecified: Secondary | ICD-10-CM | POA: Diagnosis not present

## 2021-07-28 DIAGNOSIS — M545 Low back pain, unspecified: Secondary | ICD-10-CM | POA: Diagnosis not present

## 2021-07-28 DIAGNOSIS — Z880 Allergy status to penicillin: Secondary | ICD-10-CM | POA: Diagnosis not present

## 2021-07-28 DIAGNOSIS — M2578 Osteophyte, vertebrae: Secondary | ICD-10-CM | POA: Diagnosis not present

## 2021-07-28 DIAGNOSIS — M8588 Other specified disorders of bone density and structure, other site: Secondary | ICD-10-CM | POA: Diagnosis not present

## 2021-08-03 ENCOUNTER — Encounter: Payer: Self-pay | Admitting: Family Medicine

## 2021-08-03 ENCOUNTER — Ambulatory Visit (INDEPENDENT_AMBULATORY_CARE_PROVIDER_SITE_OTHER): Payer: Medicare HMO | Admitting: Family Medicine

## 2021-08-03 VITALS — BP 180/82 | HR 59 | Temp 97.8°F | Ht 71.5 in | Wt 205.5 lb

## 2021-08-03 DIAGNOSIS — I10 Essential (primary) hypertension: Secondary | ICD-10-CM

## 2021-08-03 DIAGNOSIS — S32010A Wedge compression fracture of first lumbar vertebra, initial encounter for closed fracture: Secondary | ICD-10-CM

## 2021-08-03 DIAGNOSIS — Z09 Encounter for follow-up examination after completed treatment for conditions other than malignant neoplasm: Secondary | ICD-10-CM

## 2021-08-03 MED ORDER — METHOCARBAMOL 500 MG PO TABS: 500.0000 mg | ORAL_TABLET | Freq: Two times a day (BID) | ORAL | 0 refills | Status: DC

## 2021-08-03 MED ORDER — TRAMADOL HCL 50 MG PO TABS: 50.0000 mg | ORAL_TABLET | Freq: Four times a day (QID) | ORAL | 0 refills | Status: AC | PRN

## 2021-08-03 NOTE — Progress Notes (Signed)
Acute Office Visit  Subjective:     Patient ID: [name], male    DOB: 02-Aug-1924, 88 y.o.   MRN: [id number]  Chief Complaint  Patient presents with   ER follow up    HPI Here with daughter today. Patient is in today for an ER follow up. He was sent at [facility] on 07/28/21 for a back injury that occurred when lifting a heavy trailer. A compression fracture of L1 vertebra was noted on imaging. He reports severe pain in his midline lower back. It radiates to the right. Pain improves with rest and worsens with activity. Denies changes in gait, bowel or bladder control. Denies saddle anesthesia, numbness, or tingling. Denies fever. He has been taking robaxin and tramadol with mild relief. He was referred to a spine specialist with [facility] but he would prefer a referral to [employer] [facility] [employer].   He denies chest pain, shortness of breath, palpitations, edema, dizziness, focal weakness, or confusion.   ROS As per HPI.      Objective:    BP (!) 180/82   Pulse (!) 59   Temp 97.8 F (36.6 C) (Temporal)   Ht 5' 11.5" (1.816 m)   Wt 205 lb 8 oz (93.2 kg)   SpO2 92%   BMI 28.26 kg/m  BP Readings from Last 3 Encounters:  08/03/21 (!) 180/82  01/24/21 127/69  07/04/20 140/75      Physical Exam Vitals and nursing note reviewed.  Constitutional:      General: He is not in acute distress.    Appearance: He is not ill-appearing, toxic-appearing or diaphoretic.  Cardiovascular:     Rate and Rhythm: Normal rate and regular rhythm.     Heart sounds: Normal heart sounds. No murmur heard. Pulmonary:     Effort: No respiratory distress.     Breath sounds: Normal breath sounds.  Musculoskeletal:     Lumbar back: Tenderness and bony tenderness present. No swelling, edema or signs of trauma.     Right lower leg: No edema.     Left lower leg: No edema.  Skin:    General: Skin is warm and dry.  Neurological:     General: No focal deficit present.     Mental Status: He is  alert and oriented to person, place, and time.     Gait: Gait abnormal (walker).  Psychiatric:        Mood and Affect: Mood normal.        Behavior: Behavior normal.     No results found for any visits on 08/03/21.      Assessment & Plan:   [name] was seen today for er follow up.  Diagnoses and all orders for this visit:  Closed compression fracture of L1 vertebra, initial encounter ([place]) PDMP reviewed, no red flags. Refills provided today. Urgent referral to ortho placed.  -     Ambulatory referral to Orthopedic Surgery -     traMADol (ULTRAM) 50 MG tablet; Take 1 tablet (50 mg total) by mouth every 6 (six) hours as needed for up to 5 days. -     methocarbamol (ROBAXIN) 500 MG tablet; Take 1 tablet (500 mg total) by mouth 2 (two) times daily.  Primary hypertension BP elevated today. Asymptomatic. Monitor BP at home and notify for persistently elevated readings.   Hospital discharge follow-up Reviewed ER record.   Return to office for new or worsening symptoms, or if symptoms persist.   The patient indicates  understanding of these issues and agrees with the plan.   Gwenlyn Perking, FNP

## 2021-08-11 DIAGNOSIS — M5451 Vertebrogenic low back pain: Secondary | ICD-10-CM | POA: Diagnosis not present

## 2021-08-21 ENCOUNTER — Ambulatory Visit (INDEPENDENT_AMBULATORY_CARE_PROVIDER_SITE_OTHER): Payer: Medicare HMO

## 2021-08-21 ENCOUNTER — Encounter: Payer: Self-pay | Admitting: Family

## 2021-08-21 ENCOUNTER — Ambulatory Visit (INDEPENDENT_AMBULATORY_CARE_PROVIDER_SITE_OTHER): Payer: Medicare HMO | Admitting: Family

## 2021-08-21 VITALS — BP 140/83 | HR 88 | Temp 97.4°F | Ht 71.5 in | Wt 200.2 lb

## 2021-08-21 DIAGNOSIS — E785 Hyperlipidemia, unspecified: Secondary | ICD-10-CM

## 2021-08-21 DIAGNOSIS — J449 Chronic obstructive pulmonary disease, unspecified: Secondary | ICD-10-CM

## 2021-08-21 DIAGNOSIS — E039 Hypothyroidism, unspecified: Secondary | ICD-10-CM | POA: Diagnosis not present

## 2021-08-21 DIAGNOSIS — Z01818 Encounter for other preprocedural examination: Secondary | ICD-10-CM

## 2021-08-21 DIAGNOSIS — M1990 Unspecified osteoarthritis, unspecified site: Secondary | ICD-10-CM

## 2021-08-21 DIAGNOSIS — E1169 Type 2 diabetes mellitus with other specified complication: Secondary | ICD-10-CM

## 2021-08-21 DIAGNOSIS — I10 Essential (primary) hypertension: Secondary | ICD-10-CM | POA: Diagnosis not present

## 2021-08-21 DIAGNOSIS — S32010S Wedge compression fracture of first lumbar vertebra, sequela: Secondary | ICD-10-CM | POA: Diagnosis not present

## 2021-08-21 DIAGNOSIS — E663 Overweight: Secondary | ICD-10-CM | POA: Diagnosis not present

## 2021-08-21 LAB — BAYER DCA HB A1C WAIVED: HB A1C (BAYER DCA - WAIVED): 5.4 % (ref 4.8–5.6)

## 2021-08-21 NOTE — Patient Instructions (Signed)
Balloon Kyphoplasty Balloon kyphoplasty is a procedure to treat a spinal compression fracture, which is a collapse of the bones that form the spine (vertebrae). With this type of fracture, the vertebrae are squeezed (compressed) into a wedge shape, and this causes pain. In this procedure, the collapsed vertebrae are expanded with a balloon. Bone cement is then injected into the vertebrae to strengthen them. Tell a health care provider about: Any allergies you have. All medicines you are taking, including vitamins, herbs, eye drops, creams, and over-the-counter medicines. Any problems you or family members have had with anesthetic medicines. Any blood disorders you have. Any surgeries you have had. Any medical conditions you have or have had. Whether you are pregnant or may be pregnant. What are the risks? Generally, this is a safe procedure. However, problems may occur, including: Infection. Bleeding. Increased back pain. Allergic reactions to medicines. Damage to nearby structures, nerves, or organs. Leaking of bone cement into other parts of the body. What happens before the procedure? Staying hydrated Follow instructions from your health care provider about hydration, which may include: Up to 2 hours before the procedure - you may continue to drink clear liquids, such as water, clear fruit juice, black coffee, and plain tea.  Eating and drinking restrictions Follow instructions from your health care provider about eating and drinking, which may include: 8 hours before the procedure - stop eating heavy meals or foods, such as meat, fried foods, or fatty foods. 6 hours before the procedure - stop eating light meals or foods, such as toast or cereal. 6 hours before the procedure - stop drinking milk or drinks that contain milk. 2 hours before the procedure - stop drinking clear liquids. Medicines Ask your health care provider about: Changing or stopping your regular medicines. This is  especially important if you are taking diabetes medicines or blood thinners. Taking medicines such as aspirin and ibuprofen. These medicines can thin your blood. Do not take these medicines unless your health care provider tells you to take them. Taking over-the-counter medicines, vitamins, herbs, and supplements. General instructions Follow instructions from your health care provider about eating or drinking restrictions. Do not use any products that contain nicotine or tobacco for at least 4 weeks before the procedure. These products include cigarettes, e-cigarettes, and chewing tobacco. If you need help quitting, ask your health care provider. Ask your health care provider: How your surgery site will be marked. What steps will be taken to help prevent infection. These may include: Removing hair at the surgery site. Washing skin with a germ-killing soap. Taking antibiotic medicine. Plan to have someone take you home from the hospital or clinic. If you will be going home right after the procedure, plan to have someone with you for 24 hours. What happens during the procedure?  An IV will be inserted into one of your veins. You will be given one or more of the following: A medicine to help you relax (sedative). A medicine to numb the area (local anesthetic). A medicine to make you fall asleep (general anesthetic). Your surgeon will use an X-ray machine to see your spinal compression fracture. A hollow needle will be inserted through the skin and into the spine. Through this needle, the balloon will be placed in your spine where the fractures are located. The balloon will be inflated. This will create space and push the bone back toward its normal height and shape. The balloon will be removed. The newly created space in your spine will be  filled with bone cement that will harden quickly. When the cement hardens, the hollow needle will be removed. The needle puncture site will be closed with  skin glue or adhesive tape. A bandage (dressing) may be used to cover the needle puncture site. The procedure may vary among health care providers and hospitals. What happens after the procedure? Your blood pressure, heart rate, breathing rate, and blood oxygen level will be monitored until you leave the hospital or clinic. You will have some pain. Pain medicine will be available to help you. An ice pack may be placed over the needle site. If you were given a sedative during the procedure, it can affect you for several hours. Do not drive or operate machinery until your health care provider says that it is safe. Summary Balloon kyphoplasty is a procedure to treat a spinal compression fracture, which is a collapse of the bones that form the spine (vertebrae). Before the procedure, follow instructions from your health care provider about eating or drinking restrictions. Ask your health care provider about changing or stopping your regular medicines. This is especially important if you are taking diabetes medicines or blood thinners. Plan to have someone take you home from the hospital or clinic. If you were given a sedative during the procedure, do not drive or operate machinery until your health care provider says that it is safe. If you will be going home right after the procedure, plan to have someone with you for 24 hours. This information is not intended to replace advice given to you by your health care provider. Make sure you discuss any questions you have with your health care provider. Document Revised: 05/06/2019 Document Reviewed: 05/06/2019 Elsevier Patient Education  [name].

## 2021-08-21 NOTE — Progress Notes (Signed)
Subjective:    Patient ID: [name], male    DOB: 07-06-1924, 88 y.o.   MRN: [id number]  Chief Complaint  Patient presents with   Medical Management of Chronic Issues   PT presents to the office today for chronic follow up. He has COPD and states he has SOB at times. He quit smoking in 1983.   He had a compression fracture of L1 and continues to have pain of 8 out 10.  Hypertension This is a chronic problem. The current episode started more than 1 year ago. The problem has been resolved since onset. The problem is controlled. Associated symptoms include malaise/fatigue. Pertinent negatives include no blurred vision, peripheral edema or shortness of breath. Risk factors for coronary artery disease include dyslipidemia, obesity, male gender and sedentary lifestyle. The current treatment provides moderate improvement. Identifiable causes of hypertension include a thyroid problem.  Thyroid Problem Presents for follow-up visit. Symptoms include constipation and dry skin. Patient reports no anxiety, depressed mood, diarrhea or fatigue. The symptoms have been stable. His past medical history is significant for hyperlipidemia.  Hyperlipidemia This is a chronic problem. The current episode started more than 1 year ago. Exacerbating diseases include obesity. Pertinent negatives include no shortness of breath. Current antihyperlipidemic treatment includes statins. The current treatment provides moderate improvement of lipids. Risk factors for coronary artery disease include dyslipidemia, diabetes mellitus, hypertension, male sex and a sedentary lifestyle.  Diabetes He presents for his follow-up diabetic visit. Pertinent negatives for hypoglycemia include no nervousness/anxiousness. Pertinent negatives for diabetes include no blurred vision, no fatigue and no foot paresthesias. Diabetic complications include heart disease. Pertinent negatives for diabetic complications include no peripheral neuropathy.  Risk factors for coronary artery disease include dyslipidemia, diabetes mellitus, male sex, hypertension and sedentary lifestyle. He is following a generally healthy diet. His bedtime blood glucose range is generally 90-110 mg/dl.  Arthritis Presents for follow-up visit. He complains of pain and stiffness. The symptoms have been stable. Affected locations include the right knee, left knee, left MCP and right MCP. His pain is at a severity of 3/10. Pertinent negatives include no diarrhea or fatigue.      Review of Systems  Constitutional:  Positive for malaise/fatigue. Negative for fatigue.  Eyes:  Negative for blurred vision.  Respiratory:  Negative for shortness of breath.   Gastrointestinal:  Positive for constipation. Negative for diarrhea.  Musculoskeletal:  Positive for arthritis and stiffness.  Psychiatric/Behavioral:  The patient is not nervous/anxious.   All other systems reviewed and are negative.      Objective:   Physical Exam Vitals reviewed.  Constitutional:      General: He is not in acute distress.    Appearance: He is well-developed.  HENT:     Head: Normocephalic.     Right Ear: Tympanic membrane normal.     Left Ear: Tympanic membrane normal.  Eyes:     General:        Right eye: No discharge.        Left eye: No discharge.     Pupils: Pupils are equal, round, and reactive to light.  Neck:     Thyroid: No thyromegaly.  Cardiovascular:     Rate and Rhythm: Normal rate and regular rhythm.     Heart sounds: Normal heart sounds. No murmur heard. Pulmonary:     Effort: Pulmonary effort is normal. No respiratory distress.     Breath sounds: Normal breath sounds. No wheezing.  Abdominal:     General:  Bowel sounds are normal. There is no distension.     Palpations: Abdomen is soft.     Tenderness: There is no abdominal tenderness.  Musculoskeletal:        General: Tenderness present.     Cervical back: Normal range of motion and neck supple.     Comments:  Pain in lumbar with flexion  Skin:    General: Skin is warm and dry.     Findings: No erythema or rash.  Neurological:     Mental Status: He is alert and oriented to person, place, and time.     Cranial Nerves: No cranial nerve deficit.     Motor: Weakness (using rolling walker) present.     Gait: Gait abnormal.     Deep Tendon Reflexes: Reflexes are normal and symmetric.  Psychiatric:        Behavior: Behavior normal.        Thought Content: Thought content normal.        Judgment: Judgment normal.       BP 140/83   Pulse 88   Temp (!) 97.4 F (36.3 C)   Ht 5' 11.5" (1.816 m)   Wt 200 lb 3.2 oz (90.8 kg)   SpO2 94%   BMI 27.53 kg/m      Assessment & Plan:  [name] comes in today with chief complaint of Medical Management of Chronic Issues   Diagnosis and orders addressed:  1. Essential hypertension - CMP14+EGFR - CBC with Differential/Platelet  2. Chronic obstructive pulmonary disease, unspecified COPD type ([place]) - CMP14+EGFR - CBC with Differential/Platelet  3. Overweight (BMI 25.0-29.9) - CMP14+EGFR - CBC with Differential/Platelet  4. Arthritis - CMP14+EGFR - CBC with Differential/Platelet  5. Hyperlipidemia associated with type 2 diabetes mellitus (HCC) - CMP14+EGFR - CBC with Differential/Platelet  6. Hypothyroidism, unspecified type - CMP14+EGFR - CBC with Differential/Platelet - TSH  7. Type 2 diabetes mellitus with other specified complication, without long-term current use of insulin (HCC) - Bayer DCA Hb A1c Waived - CMP14+EGFR - CBC with Differential/Platelet  8. Pre-op exam - CMP14+EGFR - CBC with Differential/Platelet - TSH - DG Chest 2 View - EKG 12-Lead  9. Closed compression fracture of L1 vertebra, sequela Will complete Pre-op form once labs are completed    Labs pending Health Maintenance reviewed Diet and exercise encouraged  Follow up plan: 3 months    Evelina Dun, FNP

## 2021-08-21 NOTE — Addendum Note (Signed)
Addended by: Evelina Dun A on: 08/21/2021 01:55 PM   Modules accepted: Orders

## 2021-08-22 ENCOUNTER — Other Ambulatory Visit: Payer: Self-pay | Admitting: Family

## 2021-08-22 DIAGNOSIS — I729 Aneurysm of unspecified site: Secondary | ICD-10-CM

## 2021-08-22 LAB — CBC WITH DIFFERENTIAL/PLATELET
Basophils Absolute: 0 10*3/uL (ref 0.0–0.2)
Basos: 1 %
EOS (ABSOLUTE): 0.2 10*3/uL (ref 0.0–0.4)
Eos: 4 %
Hematocrit: 43.9 % (ref 37.5–51.0)
Hemoglobin: 15.1 g/dL (ref 13.0–17.7)
Immature Grans (Abs): 0 10*3/uL (ref 0.0–0.1)
Immature Granulocytes: 0 %
Lymphocytes Absolute: 1.9 10*3/uL (ref 0.7–3.1)
Lymphs: 34 %
MCH: 32.5 pg (ref 26.6–33.0)
MCHC: 34.4 g/dL (ref 31.5–35.7)
MCV: 94 fL (ref 79–97)
Monocytes Absolute: 0.4 10*3/uL (ref 0.1–0.9)
Monocytes: 8 %
Neutrophils Absolute: 3 10*3/uL (ref 1.4–7.0)
Neutrophils: 53 %
Platelets: 132 10*3/uL — ABNORMAL LOW (ref 150–450)
RBC: 4.65 x10E6/uL (ref 4.14–5.80)
RDW: 12.8 % (ref 11.6–15.4)
WBC: 5.6 10*3/uL (ref 3.4–10.8)

## 2021-08-22 LAB — CMP14+EGFR
ALT: 7 IU/L (ref 0–44)
AST: 15 IU/L (ref 0–40)
Albumin/Globulin Ratio: 2 (ref 1.2–2.2)
Albumin: 4.5 g/dL (ref 3.6–4.6)
Alkaline Phosphatase: 116 IU/L (ref 44–121)
BUN/Creatinine Ratio: 17 (ref 10–24)
BUN: 20 mg/dL (ref 10–36)
Bilirubin Total: 0.7 mg/dL (ref 0.0–1.2)
CO2: 22 mmol/L (ref 20–29)
Calcium: 9.7 mg/dL (ref 8.6–10.2)
Chloride: 101 mmol/L (ref 96–106)
Creatinine, Ser: 1.2 mg/dL (ref 0.76–1.27)
Globulin, Total: 2.3 g/dL (ref 1.5–4.5)
Glucose: 108 mg/dL — ABNORMAL HIGH (ref 70–99)
Potassium: 4.2 mmol/L (ref 3.5–5.2)
Sodium: 138 mmol/L (ref 134–144)
Total Protein: 6.8 g/dL (ref 6.0–8.5)
eGFR: 56 mL/min/{1.73_m2} — ABNORMAL LOW (ref >59)

## 2021-08-22 LAB — TSH: TSH: 1.78 u[IU]/mL (ref 0.450–4.500)

## 2021-08-23 NOTE — Progress Notes (Signed)
Returning call. Please call back, after 3 would be best.

## 2021-09-19 ENCOUNTER — Ambulatory Visit: Payer: Medicare HMO | Admitting: Cardiovascular Disease

## 2021-09-19 ENCOUNTER — Encounter: Payer: Self-pay | Admitting: Cardiovascular Disease

## 2021-09-19 VITALS — BP 102/68 | HR 66 | Ht 74.0 in | Wt 199.0 lb

## 2021-09-19 DIAGNOSIS — I700 Atherosclerosis of aorta: Secondary | ICD-10-CM

## 2021-09-19 DIAGNOSIS — Z01810 Encounter for preprocedural cardiovascular examination: Secondary | ICD-10-CM

## 2021-09-19 DIAGNOSIS — E7800 Pure hypercholesterolemia, unspecified: Secondary | ICD-10-CM | POA: Diagnosis not present

## 2021-09-19 DIAGNOSIS — R0602 Shortness of breath: Secondary | ICD-10-CM | POA: Diagnosis not present

## 2021-09-19 DIAGNOSIS — I10 Essential (primary) hypertension: Secondary | ICD-10-CM | POA: Diagnosis not present

## 2021-09-19 NOTE — Progress Notes (Signed)
Cardiology Office Note:    Date:  09/19/2021   ID:  [name], DOB 07/16/1924, MRN [id number]  PCP:  Sharion Balloon, [facility] Cardiologist:  Sanda Klein, MD     Referring MD: Sharion Balloon, FNP   Chief Complaint  Patient presents with   Pre-op Exam  [name] is a 88 y.o. male who is being seen today for the evaluation of preoperative cardiovascular risk evaluation.  At the request of Sharion Balloon, FNP and Melina Schools, MD   History of Present Illness:    [name] is a 88 y.o. male with a hx of suprarenal AAA surgical repair Sherren Mocha Early, 2004), remote history of smoking quit in 1983, suspected COPD, hyperlipidemia on statin, hypertension controlled on medications, reported previous history of diabetes, currently euglycemic without medications, history of gout, hard of hearing, blindness in the left eye, hypothyroidism compensated on levothyroxine supplementation, who has persistent pain related to an L1 compression fracture and is being considered for vertebroplasty.  He is referred for preoperative risk evaluation.  He quit smoking in 1983.  He does not use bronchodilators.  He becomes short of breath walking to the mailbox, sometimes even walking inside the house to the kitchen.  He does not have any chest pain with activity.  He will occasionally have low chest discomfort that is associated with meals and which she categorizes as indigestion.  It does not occur when he walks.  He denies palpitations or syncope.  He has not had any recent falls.  Has a variety of joint related complaints especially in his hands and knees.  Despite his multiple risk factors for coronary artery disease, he has never had issues related to this.  He had a normal nuclear stress test in 2004 but has not had any evaluation since.  He is ECG today shows sinus rhythm with first-degree AV block and poor R wave progression in the precordial leads that is likely due to left  anterior fascicular block.  There are no ischemic ECG changes.  Interestingly, his ECG look quite different on 08/21/2021 when he did not have left axis deviation but had inverted T waves in the inferior leads and a couple of PVCs.  Past Medical History:  Diagnosis Date   Blepharitis    COPD (chronic obstructive pulmonary disease) ([place])    Diabetes mellitus without complication (HCC)    Gout    Hearing impaired    Hyperlipidemia    Hypertension    Thyroid disease     Past Surgical History:  Procedure Laterality Date   ABDOMINAL AORTIC ANEURYSM REPAIR     CATARACT EXTRACTION Left    JOINT REPLACEMENT      Current Medications: Current Meds  Medication Sig   amLODipine (NORVASC) 5 MG tablet TAKE 1 TABLET EVERY DAY   benazepril (LOTENSIN) 40 MG tablet TAKE 1 TABLET EVERY DAY   doxycycline (VIBRAMYCIN) 100 MG capsule Take 100 mg by mouth 2 (two) times daily.   gentamicin (GARAMYCIN) 0.3 % ophthalmic solution Place 1 drop into the left eye in the morning, at noon, in the evening, and at bedtime.   glucose blood (ACCU-CHEK AVIVA PLUS) test strip TEST BLOOD GLUCOSE TWICE DAILY Dx E11.9   levothyroxine (SYNTHROID) 100 MCG tablet TAKE 1 TABLET EVERY DAY BEFORE BREAKFAST   neomycin-polymyxin b-dexamethasone (MAXITROL) 3.5-10000-0.1 OINT Place into the left eye at bedtime.   pravastatin (PRAVACHOL) 40 MG tablet TAKE 1 TABLET EVERY DAY  Allergies:   Penicillins   Social History   Socioeconomic History   Marital status: Widowed    Spouse name: Not on file   Number of children: 2   Years of education: 6   Highest education level: 6th grade  Occupational History   Occupation: retired    Comment: Charity fundraiser  Tobacco Use   Smoking status: Former   Smokeless tobacco: Never  Scientific laboratory technician Use: Never used  Substance and Sexual Activity   Alcohol use: No   Drug use: No   Sexual activity: Not Currently  Other Topics Concern   Not on file  Social History Narrative   Not on  file   Social Determinants of Health   Financial Resource Strain: Low Risk  (09/19/2017)   Overall Financial Resource Strain (CARDIA)    Difficulty of Paying Living Expenses: Not hard at all  Food Insecurity: No Food Insecurity (09/19/2017)   Hunger Vital Sign    Worried About Running Out of Food in the Last Year: Never true    [place] in the Last Year: Never true  Transportation Needs: No Transportation Needs (09/19/2017)   PRAPARE - Hydrologist (Medical): No    Lack of Transportation (Non-Medical): No  Physical Activity: Insufficiently Active (09/19/2017)   Exercise Vital Sign    Days of Exercise per Week: 7 days    Minutes of Exercise per Session: 20 min  Stress: No Stress Concern Present (09/19/2017)   [place]    Feeling of Stress : Not at all  Social Connections: Moderately Integrated (09/19/2017)   Social Connection and Isolation Panel [NHANES]    Frequency of Communication with Friends and Family: More than three times a week    Frequency of Social Gatherings with Friends and Family: More than three times a week    Attends Religious Services: More than 4 times per year    Active Member of [employer] or Organizations: Yes    Attends Archivist Meetings: More than 4 times per year    Marital Status: Widowed     Family History: The patient's family history includes Healthy in his daughter and daughter; Heart disease in his father; Stroke in his mother.  ROS:   Please see the history of present illness.     All other systems reviewed and are negative.  EKGs/Labs/Other Studies Reviewed:    The following studies were reviewed today: Nuclear stress test 2004  EKG:  EKG is  ordered today.  The ekg ordered today demonstrates normal sinus rhythm, left axis deviation almost meeting criteria for left anterior fascicular block, compared to ECG from 08/21/2021 inferior T wave  inversion is no longer seen and the axis has shifted leftward.  Recent Labs: 08/21/2021: ALT 7; BUN 20; Creatinine, Ser 1.20; Hemoglobin 15.1; Platelets 132; Potassium 4.2; Sodium 138; TSH 1.780  Recent Lipid Panel    Component Value Date/Time   CHOL 151 03/17/2019 0911   TRIG 89 03/17/2019 0911   TRIG 219 (H) 12/03/2012 0815   HDL 50 03/17/2019 0911   HDL 39 (L) 12/03/2012 0815   CHOLHDL 3.0 03/17/2019 0911   LDLCALC 84 03/17/2019 0911   LDLCALC 109 (H) 12/03/2012 0815     Risk Assessment/Calculations:                Physical Exam:    VS:  BP 102/68 (BP Location: Left Arm, Patient Position: Sitting,  Cuff Size: Large)   Pulse 66   Ht '6\' 2"'$  (1.88 m)   Wt 199 lb (90.3 kg)   SpO2 92%   BMI 25.55 kg/m     Wt Readings from Last 3 Encounters:  09/19/21 199 lb (90.3 kg)  08/21/21 200 lb 3.2 oz (90.8 kg)  08/03/21 205 lb 8 oz (93.2 kg)     GEN:  Well nourished, well developed in no acute distress HEENT: Normal NECK: No JVD; No carotid bruits LYMPHATICS: No lymphadenopathy CARDIAC: RRR, no murmurs, rubs, gallops RESPIRATORY:  Clear to auscultation without rales, wheezing or rhonchi  ABDOMEN: Soft, non-tender, non-distended MUSCULOSKELETAL:  No edema; No deformity  SKIN: Warm and dry NEUROLOGIC:  Alert and oriented x 3.  He is very hard of hearing.  No vision left eye. PSYCHIATRIC:  Normal affect   ASSESSMENT:    1. Shortness of breath   2. Preoperative cardiovascular examination   3. Essential hypertension   4. Hypercholesterolemia   5. Aortic atherosclerosis (HCC)    PLAN:    In order of problems listed above:  Preop CV risk: At age 81 and with previous history of smoking and AAA it is highly likely that he has coronary disease.  He does not have angina pectoris and would not be a candidate for revascularization procedures.  I do not think ischemic evaluation would do him any good, but would like to make sure that he has normal left ventricular systolic  function, since shortness of breath is his major complaint.  He has borderline bradycardia, first-degree AV block and left anterior fascicular block.  He should not receive empirical beta-blockers preoperatively.  If echo shows normal LVEF and there are no major valvular or wall motion abnormalities, I think he can proceed with the planned vertebroplasty , since this is a low risk procedure. Dyspnea: Although he quit smoking almost 40 years ago, he has about a 40-pack-year history of smoking.  It is quite possible that he has COPD.  There are no clinical findings to suggest hypervolemia/heart failure today.  Oxygen saturation on room air is 92%.  NYHA functional class II shortness of breath on exertion. HTN: Well-controlled on current medication HLP: Continue statin.  He bears a diagnosis of diabetes mellitus but most recent hemoglobin A1c was normal without any medications or particular dietary interventions.  Most recent lipid profile that I have available is from 2 years ago with acceptable values considering his age and lack of evidence of rapid vascular disease progression. Abnormal electrocardiogram: Is possible that the changes are related to varying degree of intraventricular conduction delay, rather than ischemia.  There is no correlation between his symptoms and the ECG abnormalities. AAA: History of 2 graft repair of suprarenal AAA in 2004.  Does not have any symptoms of intermittent claudication or abdominal discomfort.  Aortic atherosclerosis of the thoracic aorta is also seen on imaging studies.  CT angiogram of the aorta has been ordered.           Medication Adjustments/Labs and Tests Ordered: Current medicines are reviewed at length with the patient today.  Concerns regarding medicines are outlined above.  Orders Placed This Encounter  Procedures   EKG 12-Lead   ECHOCARDIOGRAM COMPLETE   No orders of the defined types were placed in this encounter.   Patient Instructions   Medication Instructions:  No changes *If you need a refill on your cardiac medications before your next appointment, please call your pharmacy*   Lab Work: None ordered If  you have labs (blood work) drawn today and your tests are completely normal, you will receive your results only by: [place] (if you have MyChart) OR A paper copy in the mail If you have any lab test that is abnormal or we need to change your treatment, we will call you to review the results.   Testing/Procedures: Your physician has requested that you have an echocardiogram. Echocardiography is a painless test that uses sound waves to create images of your heart. It provides your doctor with information about the size and shape of your heart and how well your heart's chambers and valves are working. You may receive an ultrasound enhancing agent through an IV if needed to better visualize your heart during the echo.This procedure takes approximately one hour. There are no restrictions for this procedure. This will take place at the [address].    Follow-Up: At [facility], you and your health needs are our priority.  As part of our continuing mission to provide you with exceptional heart care, we have created designated Provider Care Teams.  These Care Teams include your primary Cardiologist (physician) and Advanced Practice Providers (APPs -  Physician Assistants and Nurse Practitioners) who all work together to provide you with the care you need, when you need it.  We recommend signing up for the patient portal called "MyChart".  Sign up information is provided on this After Visit Summary.  MyChart is used to connect with patients for Virtual Visits (Telemedicine).  Patients are able to view lab/test results, encounter notes, upcoming appointments, etc.  Non-urgent messages can be sent to your provider as well.   To learn more about what you can do with MyChart, go to [email or web address].     Your next appointment:   Follow up as needed with Dr. Sallyanne Kuster  Important Information About Sugar         Signed, Sanda Klein, MD  09/19/2021 2:24 PM    [place]

## 2021-09-19 NOTE — Patient Instructions (Signed)
Medication Instructions:  No changes *If you need a refill on your cardiac medications before your next appointment, please call your pharmacy*   Lab Work: None ordered If you have labs (blood work) drawn today and your tests are completely normal, you will receive your results only by: [place] (if you have MyChart) OR A paper copy in the mail If you have any lab test that is abnormal or we need to change your treatment, we will call you to review the results.   Testing/Procedures: Your physician has requested that you have an echocardiogram. Echocardiography is a painless test that uses sound waves to create images of your heart. It provides your doctor with information about the size and shape of your heart and how well your heart's chambers and valves are working. You may receive an ultrasound enhancing agent through an IV if needed to better visualize your heart during the echo.This procedure takes approximately one hour. There are no restrictions for this procedure. This will take place at the [address], [address].    Follow-Up: At [facility], you and your health needs are our priority.  As part of our continuing mission to provide you with exceptional heart care, we have created designated Provider Care Teams.  These Care Teams include your primary Cardiologist (physician) and Advanced Practice Providers (APPs -  Physician Assistants and Nurse Practitioners) who all work together to provide you with the care you need, when you need it.  We recommend signing up for the patient portal called "MyChart".  Sign up information is provided on this After Visit Summary.  MyChart is used to connect with patients for Virtual Visits (Telemedicine).  Patients are able to view lab/test results, encounter notes, upcoming appointments, etc.  Non-urgent messages can be sent to your provider as well.   To learn more about what you can do with MyChart, go to [email or web address].     Your next appointment:   Follow up as needed with Dr. Sallyanne Kuster  Important Information About Sugar

## 2021-09-21 ENCOUNTER — Other Ambulatory Visit (HOSPITAL_COMMUNITY): Payer: Medicare HMO

## 2021-09-22 ENCOUNTER — Ambulatory Visit (HOSPITAL_BASED_OUTPATIENT_CLINIC_OR_DEPARTMENT_OTHER)
Admission: RE | Admit: 2021-09-22 | Discharge: 2021-09-22 | Disposition: A | Discharge status: Home / Self Care | Payer: Medicare HMO | Source: Ambulatory Visit | Attending: Cardiovascular Disease | Admitting: Cardiovascular Disease

## 2021-09-22 ENCOUNTER — Ambulatory Visit (HOSPITAL_COMMUNITY)
Admission: RE | Admit: 2021-09-22 | Discharge: 2021-09-22 | Disposition: A | Discharge status: Home / Self Care | Payer: Medicare HMO | Source: Ambulatory Visit | Attending: Family | Admitting: Family

## 2021-09-22 DIAGNOSIS — R0602 Shortness of breath: Secondary | ICD-10-CM | POA: Diagnosis not present

## 2021-09-22 DIAGNOSIS — I729 Aneurysm of unspecified site: Secondary | ICD-10-CM | POA: Insufficient documentation

## 2021-09-22 DIAGNOSIS — J439 Emphysema, unspecified: Secondary | ICD-10-CM | POA: Diagnosis not present

## 2021-09-22 LAB — ECHOCARDIOGRAM COMPLETE
Area-P 1/2: 1.81 cm2
S' Lateral: 2.9 cm

## 2021-09-22 MED ORDER — IOHEXOL 350 MG/ML SOLN
75.0000 mL | Freq: Once | INTRAVENOUS | Status: AC | PRN
  Administered 2021-09-22: 75 mL via INTRAVENOUS

## 2021-09-22 NOTE — Progress Notes (Signed)
*  PRELIMINARY RESULTS* Echocardiogram 2D Echocardiogram has been performed.  Samuel Germany 09/22/2021, 2:53 PM

## 2021-09-25 ENCOUNTER — Other Ambulatory Visit: Payer: Self-pay | Admitting: Family

## 2021-10-04 ENCOUNTER — Encounter: Payer: Self-pay | Admitting: *Deleted

## 2021-10-10 DIAGNOSIS — H5213 Myopia, bilateral: Secondary | ICD-10-CM | POA: Diagnosis not present

## 2021-10-11 DIAGNOSIS — H16223 Keratoconjunctivitis sicca, not specified as Sjogren's, bilateral: Secondary | ICD-10-CM | POA: Diagnosis not present

## 2021-10-11 DIAGNOSIS — H3589 Other specified retinal disorders: Secondary | ICD-10-CM | POA: Diagnosis not present

## 2021-10-11 DIAGNOSIS — Z961 Presence of intraocular lens: Secondary | ICD-10-CM | POA: Diagnosis not present

## 2021-10-26 DIAGNOSIS — H16223 Keratoconjunctivitis sicca, not specified as Sjogren's, bilateral: Secondary | ICD-10-CM | POA: Diagnosis not present

## 2021-10-26 DIAGNOSIS — Z961 Presence of intraocular lens: Secondary | ICD-10-CM | POA: Diagnosis not present

## 2021-10-30 DIAGNOSIS — H02886 Meibomian gland dysfunction of left eye, unspecified eyelid: Secondary | ICD-10-CM | POA: Diagnosis not present

## 2021-10-30 DIAGNOSIS — H02883 Meibomian gland dysfunction of right eye, unspecified eyelid: Secondary | ICD-10-CM | POA: Diagnosis not present

## 2021-10-30 DIAGNOSIS — Z961 Presence of intraocular lens: Secondary | ICD-10-CM | POA: Diagnosis not present

## 2021-11-14 DIAGNOSIS — H02886 Meibomian gland dysfunction of left eye, unspecified eyelid: Secondary | ICD-10-CM | POA: Diagnosis not present

## 2021-11-14 DIAGNOSIS — Z961 Presence of intraocular lens: Secondary | ICD-10-CM | POA: Diagnosis not present

## 2021-11-14 DIAGNOSIS — H02883 Meibomian gland dysfunction of right eye, unspecified eyelid: Secondary | ICD-10-CM | POA: Diagnosis not present

## 2021-12-07 ENCOUNTER — Telehealth: Payer: Self-pay | Admitting: Family

## 2021-12-07 NOTE — Telephone Encounter (Signed)
Patient aware and verbalized understanding. °

## 2021-12-07 NOTE — Telephone Encounter (Signed)
I am sorry, I do not see anything from a specialists.

## 2021-12-15 ENCOUNTER — Encounter: Payer: Self-pay | Admitting: Nurse Practitioner

## 2021-12-15 ENCOUNTER — Ambulatory Visit (INDEPENDENT_AMBULATORY_CARE_PROVIDER_SITE_OTHER): Payer: Medicare HMO | Admitting: Nurse Practitioner

## 2021-12-15 VITALS — BP 145/58 | HR 64 | Temp 98.4°F | Ht 74.0 in | Wt 197.0 lb

## 2021-12-15 DIAGNOSIS — R600 Localized edema: Secondary | ICD-10-CM

## 2021-12-15 DIAGNOSIS — L03116 Cellulitis of left lower limb: Secondary | ICD-10-CM | POA: Diagnosis not present

## 2021-12-15 DIAGNOSIS — Z23 Encounter for immunization: Secondary | ICD-10-CM

## 2021-12-15 MED ORDER — DOXYCYCLINE HYCLATE 100 MG PO TABS: 100.0000 mg | ORAL_TABLET | Freq: Two times a day (BID) | ORAL | 0 refills | Status: DC

## 2021-12-15 NOTE — Progress Notes (Signed)
Acute Office Visit  Subjective:     Patient ID: [name], male    DOB: 1925-01-29, 88 y.o.   MRN: [id number]  Chief Complaint  Patient presents with   Edema    Left ankle swollen little red about 3 wks ago when he got up from lift chair ankle twisted but says he is not in pain    HPI Patient is in today for Edema: Patient complains of edema. The location of the edema is lower leg(s) bilateral.  The edema has been moderate.  Onset of symptoms was a few months ago, unchanged since that time. The edema is present all day. The patient states the problem is long-standing.  The swelling has been aggravated by  recent pain and twisting movement on lift chair , relieved by elevation of involved area, and been associated with weight gain. Cardiac risk factors include hypertension and male gender.    Patient also presents with left lower leg cellulitis.  Patient reports redness, swelling and pain.  Few days ago.  Patient denies fever, chills and headache  Review of Systems  Constitutional: Negative.   HENT: Negative.    Eyes: Negative.   Respiratory: Negative.    Cardiovascular:  Positive for leg swelling.  Genitourinary: Negative.   Skin: Negative.  Negative for rash.  All other systems reviewed and are negative.       Objective:    BP (!) 145/58   Pulse 64   Temp 98.4 F (36.9 C) (Temporal)   Ht '6\' 2"'$  (1.88 m)   Wt 197 lb (89.4 kg)   SpO2 95%   BMI 25.29 kg/m  BP Readings from Last 3 Encounters:  12/15/21 (!) 145/58  09/19/21 102/68  08/21/21 140/83   Wt Readings from Last 3 Encounters:  12/15/21 197 lb (89.4 kg)  09/19/21 199 lb (90.3 kg)  08/21/21 200 lb 3.2 oz (90.8 kg)      Physical Exam Vitals and nursing note reviewed.  Constitutional:      Appearance: Normal appearance.  HENT:     Head: Normocephalic.     Right Ear: External ear normal.     Left Ear: External ear normal.  Eyes:     Pupils: Pupils are equal, round, and reactive to light.   Cardiovascular:     Pulses: Normal pulses.     Heart sounds: Normal heart sounds.  Pulmonary:     Effort: Pulmonary effort is normal.     Breath sounds: Normal breath sounds.  Abdominal:     General: Bowel sounds are normal.  Musculoskeletal:        General: Normal range of motion.  Skin:    General: Skin is warm.     Findings: Erythema and rash present.  Neurological:     Mental Status: He is alert and oriented to person, place, and time.  Psychiatric:        Mood and Affect: Mood normal.        Behavior: Behavior normal.     No results found for any visits on 12/15/21.      Assessment & Plan:  For edema left lower extremity, and cellulitis, advised patient to elevate left leg, compression socks, take medication as prescribed, follow-up with unresolved symptoms. In 3 days.  Problem List Items Addressed This Visit   None Visit Diagnoses     Localized edema    -  Primary   Relevant Orders   For home use only DME Other see comment  Cellulitis of left leg       Relevant Medications   doxycycline (VIBRA-TABS) 100 MG tablet       Meds ordered this encounter  Medications   doxycycline (VIBRA-TABS) 100 MG tablet    Sig: Take 1 tablet (100 mg total) by mouth 2 (two) times daily.    Dispense:  14 tablet    Refill:  0    Order Specific Question:   Supervising Provider    Answer:   Claretta Fraise [phone]    Return if symptoms worsen or fail to improve.  Ivy Lynn, NP

## 2021-12-15 NOTE — Patient Instructions (Signed)
Edema ? ?Edema is when you have too much fluid in your body or under your skin. Edema may make your legs, feet, and ankles swell. Swelling often happens in looser tissues, such as around your eyes. This is a common condition. It gets more common as you get older. ?There are many possible causes of edema. These include: ?Eating too much salt (sodium). ?Being on your feet or sitting for a long time. ?Certain medical conditions, such as: ?Pregnancy. ?Heart failure. ?Liver disease. ?Kidney disease. ?Cancer. ?Hot weather may make edema worse. Edema is usually painless. Your skin may look swollen or shiny. ?Follow these instructions at home: ?Medicines ?Take over-the-counter and prescription medicines only as told by your doctor. ?Your doctor may prescribe a medicine to help your body get rid of extra water (diuretic). Take this medicine if you are told to take it. ?Eating and drinking ?Eat a low-salt (low-sodium) diet as told by your doctor. Sometimes, eating less salt may reduce swelling. ?Depending on the cause of your swelling, you may need to limit how much fluid you drink (fluid restriction). ?General instructions ?Raise the injured area above the level of your heart while you are sitting or lying down. ?Do not sit still or stand for a long time. ?Do not wear tight clothes. Do not wear garters on your upper legs. ?Exercise your legs. This can help the swelling go down. ?Wear compression stockings as told by your doctor. It is important that these are the right size. These should be prescribed by your doctor to prevent possible injuries. ?If elastic bandages or wraps are recommended, use them as told by your doctor. ?Contact a doctor if: ?Treatment is not working. ?You have heart, liver, or kidney disease and have symptoms of edema. ?You have sudden and unexplained weight gain. ?Get help right away if: ?You have shortness of breath or chest pain. ?You cannot breathe when you lie down. ?You have pain, redness, or  warmth in the swollen areas. ?You have heart, liver, or kidney disease and get edema all of a sudden. ?You have a fever and your symptoms get worse all of a sudden. ?These symptoms may be an emergency. Get help right away. Call 911. ?Do not wait to see if the symptoms will go away. ?Do not drive yourself to the hospital. ?Summary ?Edema is when you have too much fluid in your body or under your skin. ?Edema may make your legs, feet, and ankles swell. Swelling often happens in looser tissues, such as around your eyes. ?Raise the injured area above the level of your heart while you are sitting or lying down. ?Follow your doctor's instructions about diet and how much fluid you can drink. ?This information is not intended to replace advice given to you by your health care provider. Make sure you discuss any questions you have with your health care provider. ?Document Revised: 09/19/2020 Document Reviewed: 09/19/2020 ?Elsevier Patient Education ? 2023 [employer] ? ?

## 2021-12-19 ENCOUNTER — Ambulatory Visit: Payer: Medicare HMO

## 2022-01-23 ENCOUNTER — Other Ambulatory Visit: Payer: Self-pay | Admitting: Family

## 2022-01-23 DIAGNOSIS — E1169 Type 2 diabetes mellitus with other specified complication: Secondary | ICD-10-CM

## 2022-01-23 DIAGNOSIS — E039 Hypothyroidism, unspecified: Secondary | ICD-10-CM

## 2022-01-23 DIAGNOSIS — I10 Essential (primary) hypertension: Secondary | ICD-10-CM

## 2022-01-31 ENCOUNTER — Telehealth: Payer: Self-pay | Admitting: Cardiovascular Disease

## 2022-01-31 NOTE — Telephone Encounter (Signed)
  Curtis Klein, [name]     Echo looks good. I am sending Dr. Rolena Infante a note regarding "clearance" for the vertebroplasty today.    Returned call to patients daughter- okay per DPR advised her of Dr. Victorino December result note- patients daughter verbalized understanding. Advised her to call back with any issues, questions, or concerns.

## 2022-01-31 NOTE — Telephone Encounter (Signed)
Rose, patient's daughter is calling for results to the echo patient had done a couple of months ago.  If she is not called back today, please call tomorrow after 3pm.

## 2022-02-22 ENCOUNTER — Ambulatory Visit: Payer: Medicare HMO | Admitting: Family

## 2022-03-19 ENCOUNTER — Ambulatory Visit: Payer: Medicare HMO | Admitting: Family

## 2022-04-02 ENCOUNTER — Encounter: Payer: Self-pay | Admitting: Family

## 2022-04-02 ENCOUNTER — Ambulatory Visit (INDEPENDENT_AMBULATORY_CARE_PROVIDER_SITE_OTHER): Payer: Medicare HMO | Admitting: Family

## 2022-04-02 VITALS — BP 166/77 | HR 82 | Temp 96.5°F | Wt 195.0 lb

## 2022-04-02 DIAGNOSIS — E785 Hyperlipidemia, unspecified: Secondary | ICD-10-CM

## 2022-04-02 DIAGNOSIS — E663 Overweight: Secondary | ICD-10-CM | POA: Diagnosis not present

## 2022-04-02 DIAGNOSIS — Z6825 Body mass index (BMI) 25.0-25.9, adult: Secondary | ICD-10-CM

## 2022-04-02 DIAGNOSIS — I10 Essential (primary) hypertension: Secondary | ICD-10-CM

## 2022-04-02 DIAGNOSIS — M1990 Unspecified osteoarthritis, unspecified site: Secondary | ICD-10-CM

## 2022-04-02 DIAGNOSIS — E1169 Type 2 diabetes mellitus with other specified complication: Secondary | ICD-10-CM

## 2022-04-02 DIAGNOSIS — J449 Chronic obstructive pulmonary disease, unspecified: Secondary | ICD-10-CM

## 2022-04-02 DIAGNOSIS — E039 Hypothyroidism, unspecified: Secondary | ICD-10-CM | POA: Diagnosis not present

## 2022-04-02 DIAGNOSIS — Z23 Encounter for immunization: Secondary | ICD-10-CM

## 2022-04-02 LAB — CMP14+EGFR
ALT: 7 IU/L (ref 0–44)
AST: 15 IU/L (ref 0–40)
Albumin/Globulin Ratio: 1.9 (ref 1.2–2.2)
Albumin: 4.5 g/dL (ref 3.6–4.6)
Alkaline Phosphatase: 76 IU/L (ref 44–121)
BUN/Creatinine Ratio: 23 (ref 10–24)
BUN: 25 mg/dL (ref 10–36)
Bilirubin Total: 0.7 mg/dL (ref 0.0–1.2)
CO2: 22 mmol/L (ref 20–29)
Calcium: 9.1 mg/dL (ref 8.6–10.2)
Chloride: 104 mmol/L (ref 96–106)
Creatinine, Ser: 1.08 mg/dL (ref 0.76–1.27)
Globulin, Total: 2.4 g/dL (ref 1.5–4.5)
Glucose: 98 mg/dL (ref 70–99)
Potassium: 4 mmol/L (ref 3.5–5.2)
Sodium: 140 mmol/L (ref 134–144)
Total Protein: 6.9 g/dL (ref 6.0–8.5)
eGFR: 63 mL/min/{1.73_m2} (ref >59)

## 2022-04-02 LAB — CBC WITH DIFFERENTIAL/PLATELET
Basophils Absolute: 0 10*3/uL (ref 0.0–0.2)
Basos: 1 %
EOS (ABSOLUTE): 0.2 10*3/uL (ref 0.0–0.4)
Eos: 3 %
Hematocrit: 41.9 % (ref 37.5–51.0)
Hemoglobin: 14.7 g/dL (ref 13.0–17.7)
Immature Grans (Abs): 0 10*3/uL (ref 0.0–0.1)
Immature Granulocytes: 0 %
Lymphocytes Absolute: 1.8 10*3/uL (ref 0.7–3.1)
Lymphs: 38 %
MCH: 33.3 pg — ABNORMAL HIGH (ref 26.6–33.0)
MCHC: 35.1 g/dL (ref 31.5–35.7)
MCV: 95 fL (ref 79–97)
Monocytes Absolute: 0.4 10*3/uL (ref 0.1–0.9)
Monocytes: 9 %
Neutrophils Absolute: 2.3 10*3/uL (ref 1.4–7.0)
Neutrophils: 49 %
Platelets: 122 10*3/uL — ABNORMAL LOW (ref 150–450)
RBC: 4.42 x10E6/uL (ref 4.14–5.80)
RDW: 12 % (ref 11.6–15.4)
WBC: 4.7 10*3/uL (ref 3.4–10.8)

## 2022-04-02 LAB — BAYER DCA HB A1C WAIVED: HB A1C (BAYER DCA - WAIVED): 5.6 % (ref 4.8–5.6)

## 2022-04-02 MED ORDER — AMLODIPINE BESYLATE 10 MG PO TABS: 10.0000 mg | ORAL_TABLET | Freq: Every day | ORAL | 1 refills | Status: DC

## 2022-04-02 NOTE — Progress Notes (Signed)
Subjective:    Patient ID: [name], male    DOB: 1924/10/22, 89 y.o.   MRN: [id number]  Chief Complaint  Patient presents with   Medical Management of Chronic Issues   PT presents to the office today for chronic follow up. He has COPD and states he has SOB at times. He quit smoking in 1983.    Hypertension This is a chronic problem. The current episode started more than 1 year ago. The problem has been waxing and waning since onset. The problem is uncontrolled. Associated symptoms include peripheral edema and shortness of breath. Pertinent negatives include no blurred vision or malaise/fatigue. Risk factors for coronary artery disease include diabetes mellitus, male gender, smoking/tobacco exposure, sedentary lifestyle and obesity. The current treatment provides mild improvement. Identifiable causes of hypertension include a thyroid problem.  Thyroid Problem Presents for follow-up visit. Symptoms include dry skin. Patient reports no anxiety, constipation, diarrhea or fatigue. The symptoms have been stable. His past medical history is significant for hyperlipidemia.  Hyperlipidemia This is a chronic problem. The current episode started more than 1 year ago. The problem is controlled. Recent lipid tests were reviewed and are normal. Exacerbating diseases include obesity. Associated symptoms include shortness of breath. Current antihyperlipidemic treatment includes statins. The current treatment provides moderate improvement of lipids. Risk factors for coronary artery disease include dyslipidemia, diabetes mellitus, hypertension, male sex and a sedentary lifestyle.  Diabetes He presents for his follow-up diabetic visit. He has type 2 diabetes mellitus. Pertinent negatives for hypoglycemia include no nervousness/anxiousness. Associated symptoms include foot paresthesias. Pertinent negatives for diabetes include no blurred vision and no fatigue. Symptoms are stable. Diabetic complications include  peripheral neuropathy. Risk factors for coronary artery disease include diabetes mellitus, dyslipidemia, hypertension, sedentary lifestyle, male sex and obesity. He is following a generally unhealthy diet. His overall blood glucose range is 90-110 mg/dl. Eye exam is current.  Arthritis Presents for follow-up visit. He complains of pain and stiffness. The symptoms have been stable. Affected locations include the left knee, left MCP and right MCP (back). His pain is at a severity of 7/10. Pertinent negatives include no diarrhea or fatigue.      Review of Systems  Constitutional:  Negative for fatigue and malaise/fatigue.  Eyes:  Negative for blurred vision.  Respiratory:  Positive for shortness of breath.   Gastrointestinal:  Negative for constipation and diarrhea.  Musculoskeletal:  Positive for arthritis and stiffness.  Psychiatric/Behavioral:  The patient is not nervous/anxious.   All other systems reviewed and are negative.      Objective:   Physical Exam Vitals reviewed.  Constitutional:      General: He is not in acute distress.    Appearance: He is well-developed. He is obese.  HENT:     Head: Normocephalic.     Right Ear: Tympanic membrane normal.     Left Ear: Tympanic membrane normal.  Eyes:     General:        Right eye: No discharge.        Left eye: No discharge.     Pupils: Pupils are equal, round, and reactive to light.  Neck:     Thyroid: No thyromegaly.  Cardiovascular:     Rate and Rhythm: Normal rate and regular rhythm.     Heart sounds: Normal heart sounds. No murmur heard. Pulmonary:     Effort: Pulmonary effort is normal. No respiratory distress.     Breath sounds: Normal breath sounds. No wheezing.  Abdominal:  General: Bowel sounds are normal. There is no distension.     Palpations: Abdomen is soft.     Tenderness: There is no abdominal tenderness.  Musculoskeletal:        General: No tenderness. Normal range of motion.     Cervical back: Normal  range of motion and neck supple.  Skin:    General: Skin is warm and dry.     Findings: No erythema or rash.  Neurological:     Mental Status: He is alert and oriented to person, place, and time.     Cranial Nerves: No cranial nerve deficit.     Deep Tendon Reflexes: Reflexes are normal and symmetric.  Psychiatric:        Behavior: Behavior normal.        Thought Content: Thought content normal.        Judgment: Judgment normal.       BP (!) 174/80   Pulse (!) 58   Temp (!) 96.5 F (35.8 C) (Temporal)   Wt 195 lb (88.5 kg)   SpO2 94%   BMI 25.04 kg/m      Assessment & Plan:  CHINMAY DOTHARD comes in today with chief complaint of Medical Management of Chronic Issues   Diagnosis and orders addressed:  1. Type 2 diabetes mellitus with other specified complication, without long-term current use of insulin (HCC) - Bayer DCA Hb A1c Waived - CMP14+EGFR - CBC with Differential/Platelet  2. Essential hypertension Will increase Norvasc to 10 mg from 5 mg -Daily blood pressure log given with instructions on how to fill out and told to bring to next visit -Dash diet information given -Exercise encouraged - Stress Management  -Continue current meds -RTO in 2 weeks - CMP14+EGFR - CBC with Differential/Platelet - amLODipine (NORVASC) 10 MG tablet; Take 1 tablet (10 mg total) by mouth daily.  Dispense: 90 tablet; Refill: 1  3. Chronic obstructive pulmonary disease, unspecified COPD type ([place]) - CMP14+EGFR - CBC with Differential/Platelet  4. Arthritis - CMP14+EGFR - CBC with Differential/Platelet  5. Hypothyroidism, unspecified type - CMP14+EGFR - CBC with Differential/Platelet  6. Hyperlipidemia associated with type 2 diabetes mellitus (HCC) - CMP14+EGFR - CBC with Differential/Platelet  7. Overweight (BMI 25.0-29.9) - CMP14+EGFR - CBC with Differential/Platelet   Labs pending Health Maintenance reviewed Diet and exercise encouraged  Follow up plan: 2 weeks for  HTN  Evelina Dun, FNP

## 2022-04-02 NOTE — Patient Instructions (Signed)
Hypertension, Adult High blood pressure (hypertension) is when the force of blood pumping through the arteries is too strong. The arteries are the blood vessels that carry blood from the heart throughout the body. Hypertension forces the heart to work harder to pump blood and may cause arteries to become narrow or stiff. Untreated or uncontrolled hypertension can lead to a heart attack, heart failure, a stroke, kidney disease, and other problems. A blood pressure reading consists of a higher number over a lower number. Ideally, your blood pressure should be below 120/80. The first ("top") number is called the systolic pressure. It is a measure of the pressure in your arteries as your heart beats. The second ("bottom") number is called the diastolic pressure. It is a measure of the pressure in your arteries as the heart relaxes. What are the causes? The exact cause of this condition is not known. There are some conditions that result in high blood pressure. What increases the risk? Certain factors may make you more likely to develop high blood pressure. Some of these risk factors are under your control, including: Smoking. Not getting enough exercise or physical activity. Being overweight. Having too much fat, sugar, calories, or salt (sodium) in your diet. Drinking too much alcohol. Other risk factors include: Having a personal history of heart disease, diabetes, high cholesterol, or kidney disease. Stress. Having a family history of high blood pressure and high cholesterol. Having obstructive sleep apnea. Age. The risk increases with age. What are the signs or symptoms? High blood pressure may not cause symptoms. Very high blood pressure (hypertensive crisis) may cause: Headache. Fast or irregular heartbeats (palpitations). Shortness of breath. Nosebleed. Nausea and vomiting. Vision changes. Severe chest pain, dizziness, and seizures. How is this diagnosed? This condition is diagnosed by  measuring your blood pressure while you are seated, with your arm resting on a flat surface, your legs uncrossed, and your feet flat on the floor. The cuff of the blood pressure monitor will be placed directly against the skin of your upper arm at the level of your heart. Blood pressure should be measured at least twice using the same arm. Certain conditions can cause a difference in blood pressure between your right and left arms. If you have a high blood pressure reading during one visit or you have normal blood pressure with other risk factors, you may be asked to: Return on a different day to have your blood pressure checked again. Monitor your blood pressure at home for 1 week or longer. If you are diagnosed with hypertension, you may have other blood or imaging tests to help your health care provider understand your overall risk for other conditions. How is this treated? This condition is treated by making healthy lifestyle changes, such as eating healthy foods, exercising more, and reducing your alcohol intake. You may be referred for counseling on a healthy diet and physical activity. Your health care provider may prescribe medicine if lifestyle changes are not enough to get your blood pressure under control and if: Your systolic blood pressure is above 130. Your diastolic blood pressure is above 80. Your personal target blood pressure may vary depending on your medical conditions, your age, and other factors. Follow these instructions at home: Eating and drinking  Eat a diet that is high in fiber and potassium, and low in sodium, added sugar, and fat. An example of this eating plan is called the DASH diet. DASH stands for Dietary Approaches to Stop Hypertension. To eat this way: Eat   plenty of fresh fruits and vegetables. Try to fill one half of your plate at each meal with fruits and vegetables. Eat whole grains, such as whole-wheat pasta, brown rice, or whole-grain bread. Fill about one  fourth of your plate with whole grains. Eat or drink low-fat dairy products, such as skim milk or low-fat yogurt. Avoid fatty cuts of meat, processed or cured meats, and poultry with skin. Fill about one fourth of your plate with lean proteins, such as fish, chicken without skin, beans, eggs, or tofu. Avoid pre-made and processed foods. These tend to be higher in sodium, added sugar, and fat. Reduce your daily sodium intake. Many people with hypertension should eat less than 1,500 mg of sodium a day. Do not drink alcohol if: Your health care provider tells you not to drink. You are pregnant, may be pregnant, or are planning to become pregnant. If you drink alcohol: Limit how much you have to: 0-1 drink a day for women. 0-2 drinks a day for men. Know how much alcohol is in your drink. In the [place] one drink equals one 12 oz bottle of beer (355 mL), one 5 oz glass of wine (148 mL), or one 1 oz glass of hard liquor (44 mL). Lifestyle  Work with your health care provider to maintain a healthy body weight or to lose weight. Ask what an ideal weight is for you. Get at least 30 minutes of exercise that causes your heart to beat faster (aerobic exercise) most days of the week. Activities may include walking, swimming, or biking. Include exercise to strengthen your muscles (resistance exercise), such as Pilates or lifting weights, as part of your weekly exercise routine. Try to do these types of exercises for 30 minutes at least 3 days a week. Do not use any products that contain nicotine or tobacco. These products include cigarettes, chewing tobacco, and vaping devices, such as e-cigarettes. If you need help quitting, ask your health care provider. Monitor your blood pressure at home as told by your health care provider. Keep all follow-up visits. This is important. Medicines Take over-the-counter and prescription medicines only as told by your health care provider. Follow directions carefully. Blood  pressure medicines must be taken as prescribed. Do not skip doses of blood pressure medicine. Doing this puts you at risk for problems and can make the medicine less effective. Ask your health care provider about side effects or reactions to medicines that you should watch for. Contact a health care provider if you: Think you are having a reaction to a medicine you are taking. Have headaches that keep coming back (recurring). Feel dizzy. Have swelling in your ankles. Have trouble with your vision. Get help right away if you: Develop a severe headache or confusion. Have unusual weakness or numbness. Feel faint. Have severe pain in your chest or abdomen. Vomit repeatedly. Have trouble breathing. These symptoms may be an emergency. Get help right away. Call 911. Do not wait to see if the symptoms will go away. Do not drive yourself to the hospital. Summary Hypertension is when the force of blood pumping through your arteries is too strong. If this condition is not controlled, it may put you at risk for serious complications. Your personal target blood pressure may vary depending on your medical conditions, your age, and other factors. For most people, a normal blood pressure is less than 120/80. Hypertension is treated with lifestyle changes, medicines, or a combination of both. Lifestyle changes include losing weight, eating a healthy,   low-sodium diet, exercising more, and limiting alcohol. This information is not intended to replace advice given to you by your health care provider. Make sure you discuss any questions you have with your health care provider. Document Revised: 11/22/2020 Document Reviewed: 11/22/2020 Elsevier Patient Education  2023 Elsevier Inc.  

## 2022-04-16 ENCOUNTER — Ambulatory Visit: Payer: Medicare HMO | Admitting: Family

## 2022-04-19 ENCOUNTER — Ambulatory Visit (INDEPENDENT_AMBULATORY_CARE_PROVIDER_SITE_OTHER): Payer: Medicare HMO | Admitting: Family

## 2022-04-19 ENCOUNTER — Encounter: Payer: Self-pay | Admitting: Family

## 2022-04-19 VITALS — BP 126/56 | HR 99 | Temp 97.7°F | Ht 74.4 in | Wt 195.4 lb

## 2022-04-19 DIAGNOSIS — W19XXXA Unspecified fall, initial encounter: Secondary | ICD-10-CM | POA: Diagnosis not present

## 2022-04-19 DIAGNOSIS — R296 Repeated falls: Secondary | ICD-10-CM | POA: Diagnosis not present

## 2022-04-19 DIAGNOSIS — S61216A Laceration without foreign body of right little finger without damage to nail, initial encounter: Secondary | ICD-10-CM | POA: Diagnosis not present

## 2022-04-19 DIAGNOSIS — Y92009 Unspecified place in unspecified non-institutional (private) residence as the place of occurrence of the external cause: Secondary | ICD-10-CM | POA: Diagnosis not present

## 2022-04-19 DIAGNOSIS — I10 Essential (primary) hypertension: Secondary | ICD-10-CM

## 2022-04-19 NOTE — Progress Notes (Signed)
Subjective:    Patient ID: [name], male    DOB: Jul 07, 1924, 89 y.o.   MRN: [id number]  Chief Complaint  Patient presents with   Fall   PT presents to the office today to recheck HTN. Pt's Norvasc was increased to 10 mg from 5 mg. His BP is at goal today.   He has fallen several times a home, because patient refuses to use rolling walker. He fell this morning at home and hit his right lateral hand on the table. Has a skin tear on right lateral pinky that is actively bleeding.  Fall The accident occurred 12 to 24 hours ago. The volume of blood lost was minimal. Point of impact: right hand. The pain is mild.  Hypertension This is a chronic problem. The current episode started more than 1 year ago. Associated symptoms include malaise/fatigue and peripheral edema.      Review of Systems  Constitutional:  Positive for malaise/fatigue.  All other systems reviewed and are negative.      Objective:   Physical Exam Vitals reviewed.  Constitutional:      General: He is not in acute distress.    Appearance: He is well-developed.  HENT:     Head: Normocephalic.     Right Ear: Tympanic membrane normal.     Left Ear: Tympanic membrane normal.  Eyes:     General:        Right eye: No discharge.        Left eye: No discharge.     Pupils: Pupils are equal, round, and reactive to light.  Neck:     Thyroid: No thyromegaly.  Cardiovascular:     Rate and Rhythm: Normal rate and regular rhythm.     Heart sounds: Normal heart sounds. No murmur heard. Pulmonary:     Effort: Pulmonary effort is normal. No respiratory distress.     Breath sounds: Normal breath sounds. No wheezing.  Abdominal:     General: Bowel sounds are normal. There is no distension.     Palpations: Abdomen is soft.     Tenderness: There is no abdominal tenderness.  Musculoskeletal:        General: No tenderness. Normal range of motion.       Arms:     Cervical back: Normal range of motion and neck supple.      Comments: 3 cm Laceration on right fifth digit on second joint  Skin:    General: Skin is warm and dry.     Findings: Laceration present. No erythema or rash.  Neurological:     Mental Status: He is alert and oriented to person, place, and time.     Cranial Nerves: No cranial nerve deficit.     Deep Tendon Reflexes: Reflexes are normal and symmetric.  Psychiatric:        Behavior: Behavior normal.        Thought Content: Thought content normal.        Judgment: Judgment normal.     Verbal consent given by patient. Local anesthesia Lidocaine 2% without 49ml Betadine prep 4-0 suture-4 simple interrupted stitches Cleaned with Saline Antibiotic ointment Dressing applied   Ht 6' 2.4" (1.89 m)   Wt 195 lb 6.4 oz (88.6 kg)   BMI 24.82 kg/m      Assessment & Plan:  [name] comes in today with chief complaint of Fall   Diagnosis and orders addressed:  1. Essential hypertension At goal  2. Laceration of  right little finger without foreign body without damage to nail, initial encounter Sutures placed  Report any s/s of infection  - Ambulatory referral to [name]  3. Frequent falls - Ambulatory referral to [name]  4. Fall in home, initial encounter Referral to PT  - Ambulatory referral to [name] pending Health Maintenance reviewed Diet and exercise encouraged  Follow up plan: 10 day to have sutures removed    Curtis Dun, FNP

## 2022-04-19 NOTE — Patient Instructions (Signed)
Hypertension, Adult High blood pressure (hypertension) is when the force of blood pumping through the arteries is too strong. The arteries are the blood vessels that carry blood from the heart throughout the body. Hypertension forces the heart to work harder to pump blood and may cause arteries to become narrow or stiff. Untreated or uncontrolled hypertension can lead to a heart attack, heart failure, a stroke, kidney disease, and other problems. A blood pressure reading consists of a higher number over a lower number. Ideally, your blood pressure should be below 120/80. The first ("top") number is called the systolic pressure. It is a measure of the pressure in your arteries as your heart beats. The second ("bottom") number is called the diastolic pressure. It is a measure of the pressure in your arteries as the heart relaxes. What are the causes? The exact cause of this condition is not known. There are some conditions that result in high blood pressure. What increases the risk? Certain factors may make you more likely to develop high blood pressure. Some of these risk factors are under your control, including: Smoking. Not getting enough exercise or physical activity. Being overweight. Having too much fat, sugar, calories, or salt (sodium) in your diet. Drinking too much alcohol. Other risk factors include: Having a personal history of heart disease, diabetes, high cholesterol, or kidney disease. Stress. Having a family history of high blood pressure and high cholesterol. Having obstructive sleep apnea. Age. The risk increases with age. What are the signs or symptoms? High blood pressure may not cause symptoms. Very high blood pressure (hypertensive crisis) may cause: Headache. Fast or irregular heartbeats (palpitations). Shortness of breath. Nosebleed. Nausea and vomiting. Vision changes. Severe chest pain, dizziness, and seizures. How is this diagnosed? This condition is diagnosed by  measuring your blood pressure while you are seated, with your arm resting on a flat surface, your legs uncrossed, and your feet flat on the floor. The cuff of the blood pressure monitor will be placed directly against the skin of your upper arm at the level of your heart. Blood pressure should be measured at least twice using the same arm. Certain conditions can cause a difference in blood pressure between your right and left arms. If you have a high blood pressure reading during one visit or you have normal blood pressure with other risk factors, you may be asked to: Return on a different day to have your blood pressure checked again. Monitor your blood pressure at home for 1 week or longer. If you are diagnosed with hypertension, you may have other blood or imaging tests to help your health care provider understand your overall risk for other conditions. How is this treated? This condition is treated by making healthy lifestyle changes, such as eating healthy foods, exercising more, and reducing your alcohol intake. You may be referred for counseling on a healthy diet and physical activity. Your health care provider may prescribe medicine if lifestyle changes are not enough to get your blood pressure under control and if: Your systolic blood pressure is above 130. Your diastolic blood pressure is above 80. Your personal target blood pressure may vary depending on your medical conditions, your age, and other factors. Follow these instructions at home: Eating and drinking  Eat a diet that is high in fiber and potassium, and low in sodium, added sugar, and fat. An example of this eating plan is called the DASH diet. DASH stands for Dietary Approaches to Stop Hypertension. To eat this way: Eat   plenty of fresh fruits and vegetables. Try to fill one half of your plate at each meal with fruits and vegetables. Eat whole grains, such as whole-wheat pasta, brown rice, or whole-grain bread. Fill about one  fourth of your plate with whole grains. Eat or drink low-fat dairy products, such as skim milk or low-fat yogurt. Avoid fatty cuts of meat, processed or cured meats, and poultry with skin. Fill about one fourth of your plate with lean proteins, such as fish, chicken without skin, beans, eggs, or tofu. Avoid pre-made and processed foods. These tend to be higher in sodium, added sugar, and fat. Reduce your daily sodium intake. Many people with hypertension should eat less than 1,500 mg of sodium a day. Do not drink alcohol if: Your health care provider tells you not to drink. You are pregnant, may be pregnant, or are planning to become pregnant. If you drink alcohol: Limit how much you have to: 0-1 drink a day for women. 0-2 drinks a day for men. Know how much alcohol is in your drink. In the [place] one drink equals one 12 oz bottle of beer (355 mL), one 5 oz glass of wine (148 mL), or one 1 oz glass of hard liquor (44 mL). Lifestyle  Work with your health care provider to maintain a healthy body weight or to lose weight. Ask what an ideal weight is for you. Get at least 30 minutes of exercise that causes your heart to beat faster (aerobic exercise) most days of the week. Activities may include walking, swimming, or biking. Include exercise to strengthen your muscles (resistance exercise), such as Pilates or lifting weights, as part of your weekly exercise routine. Try to do these types of exercises for 30 minutes at least 3 days a week. Do not use any products that contain nicotine or tobacco. These products include cigarettes, chewing tobacco, and vaping devices, such as e-cigarettes. If you need help quitting, ask your health care provider. Monitor your blood pressure at home as told by your health care provider. Keep all follow-up visits. This is important. Medicines Take over-the-counter and prescription medicines only as told by your health care provider. Follow directions carefully. Blood  pressure medicines must be taken as prescribed. Do not skip doses of blood pressure medicine. Doing this puts you at risk for problems and can make the medicine less effective. Ask your health care provider about side effects or reactions to medicines that you should watch for. Contact a health care provider if you: Think you are having a reaction to a medicine you are taking. Have headaches that keep coming back (recurring). Feel dizzy. Have swelling in your ankles. Have trouble with your vision. Get help right away if you: Develop a severe headache or confusion. Have unusual weakness or numbness. Feel faint. Have severe pain in your chest or abdomen. Vomit repeatedly. Have trouble breathing. These symptoms may be an emergency. Get help right away. Call 911. Do not wait to see if the symptoms will go away. Do not drive yourself to the hospital. Summary Hypertension is when the force of blood pumping through your arteries is too strong. If this condition is not controlled, it may put you at risk for serious complications. Your personal target blood pressure may vary depending on your medical conditions, your age, and other factors. For most people, a normal blood pressure is less than 120/80. Hypertension is treated with lifestyle changes, medicines, or a combination of both. Lifestyle changes include losing weight, eating a healthy,   low-sodium diet, exercising more, and limiting alcohol. This information is not intended to replace advice given to you by your health care provider. Make sure you discuss any questions you have with your health care provider. Document Revised: 11/22/2020 Document Reviewed: 11/22/2020 Elsevier Patient Education  2023 Elsevier Inc.  

## 2022-04-26 DIAGNOSIS — H16042 Marginal corneal ulcer, left eye: Secondary | ICD-10-CM | POA: Diagnosis not present

## 2022-04-26 DIAGNOSIS — Z961 Presence of intraocular lens: Secondary | ICD-10-CM | POA: Diagnosis not present

## 2022-04-26 DIAGNOSIS — H02886 Meibomian gland dysfunction of left eye, unspecified eyelid: Secondary | ICD-10-CM | POA: Diagnosis not present

## 2022-04-26 DIAGNOSIS — H02883 Meibomian gland dysfunction of right eye, unspecified eyelid: Secondary | ICD-10-CM | POA: Diagnosis not present

## 2022-04-26 DIAGNOSIS — H16223 Keratoconjunctivitis sicca, not specified as Sjogren's, bilateral: Secondary | ICD-10-CM | POA: Diagnosis not present

## 2022-04-26 DIAGNOSIS — H02135 Senile ectropion of left lower eyelid: Secondary | ICD-10-CM | POA: Diagnosis not present

## 2022-04-26 LAB — HM DIABETES EYE EXAM

## 2022-04-30 ENCOUNTER — Ambulatory Visit (INDEPENDENT_AMBULATORY_CARE_PROVIDER_SITE_OTHER): Payer: Medicare HMO | Admitting: Family

## 2022-04-30 ENCOUNTER — Encounter: Payer: Self-pay | Admitting: Family

## 2022-04-30 VITALS — BP 166/78 | HR 52 | Temp 97.0°F | Ht 74.0 in | Wt 198.0 lb

## 2022-04-30 DIAGNOSIS — Z4802 Encounter for removal of sutures: Secondary | ICD-10-CM

## 2022-04-30 DIAGNOSIS — S61210D Laceration without foreign body of right index finger without damage to nail, subsequent encounter: Secondary | ICD-10-CM | POA: Diagnosis not present

## 2022-04-30 MED ORDER — SULFAMETHOXAZOLE-TRIMETHOPRIM 800-160 MG PO TABS
1.0000 | ORAL_TABLET | Freq: Two times a day (BID) | ORAL | 0 refills | Status: DC
Start: 2022-04-30 — End: 2022-05-07

## 2022-04-30 NOTE — Patient Instructions (Signed)
Laceration Care, Adult A laceration is a cut that may go through all layers of the skin and into the tissue that is right under the skin. Some lacerations heal on their own. Others need to be closed with stitches (sutures), staples, skin adhesive strips, or skin glue. Proper care of a laceration reduces the risk for infection, helps the laceration heal better, and may prevent scarring. General tips Keep the wound clean and dry. Do not scratch or pick at the wound. Wash your hands with soap and water for at least 20 seconds before and after touching your wound or changing your bandage (dressing). If soap and water are not available, use hand sanitizer. Do not usedisinfectants or antiseptics, such as rubbing alcohol, to clean your wound unless told by your health care provider. If you were given a dressing, you should change it at least once a day, or as told by your health care provider. You should also change it if it becomes wet or dirty. How to care for your laceration If sutures or staples were used: Keep the wound completely dry for the first 24 hours, or as told by your health care provider. After that time, you may shower or bathe. Do not soak your wound in water until after the sutures or staples have been removed. Clean the wound once each day, or as told by your health care provider. To do this: Wash the wound with soap and water. Rinse the wound with water to remove all soap. Pat the wound dry with a clean towel. Do not rub the wound. After cleaning the wound, apply a thin layer of antibiotic ointment, other topical ointments, or a non-adherent dressing as told by your health care provider. This will help prevent infection and keep the dressing from sticking to the wound. Have the sutures or staples removed as told by your health care provider. Do not  remove sutures or staples yourself. If skin adhesive strips were used: Do not get the skin adhesive strips wet. You may shower or bathe,  but keep the wound dry. If the wound gets wet, pat it dry with a clean towel. Do not rub the wound. Skin adhesive strips fall off on their own. If adhesive strip edges start to loosen and curl up, you may trim the loose edges. Do not remove adhesive strips completely unless your health care provider tells you to do that. If skin glue was used: You may shower or bathe, but try to keep the wound dry. Do not soak the wound in water. After showering or bathing, pat the wound dry with a clean towel. Do not rub the wound. Do not do any activities that will make you sweat a lot until the skin glue has fallen off. Do not apply liquid, cream, or ointment medicine to the wound while the skin glue is in place. Doing this may loosen the film before the wound has healed. If a dressing is placed over the wound, do not apply tape directly over the skin glue. Doing this may cause the glue to be pulled off before the wound has healed. Do not pick at the glue. Skin glue usually remains in place for 5-10 days and then falls off the skin. Follow these instructions at home: Medicines Take over-the-counter and prescription medicines only as told by your health care provider. If you were prescribed an antibiotic medicine or ointment, take or apply it as told by your health care provider. Do not stop using it even if   your condition improves. Managing pain and swelling If directed, put ice on the injured area. To do this: Put ice in a plastic bag. Place a towel between your skin and the bag. Leave the ice on for 20 minutes, 2-3 times a day. Remove the ice if your skin turns bright red. This is very important. If you cannot feel pain, heat, or cold, you have a greater risk of damage to the area. Raise (elevate) the injured area above the level of your heart while you are sitting or lying down for the first 24-48 hours after the laceration is repaired. General instructions  Avoid any activity that could cause your wound  to reopen. Check your wound every day for signs of infection. Watch for: More redness, swelling, or pain. Fluid or blood. Warmth. Pus or a bad smell. Keep all follow-up visits. This is important. Contact a health care provider if: You received a tetanus shot and you have swelling, severe pain, redness, or bleeding at the injection site. Your closed wound breaks open. You have any of these signs of infection: More redness, swelling, or pain around your wound. Fluid or blood coming from your wound. Warmth coming from your wound. Pus or a bad smell coming from your wound. A fever. You notice something coming out of the wound, such as wood or glass. Your pain is not controlled with medicine. You notice a change in the color of your skin near your wound. You need to change the dressing often. You develop a new rash. You have numbness around the wound. Get help right away if: You develop severe swelling around the wound. Your pain suddenly increases and is severe. You develop painful lumps near the wound or on skin anywhere else on your body. You have a red streak going away from your wound. The wound is on your hand or foot, and you cannot properly move a finger or toe. The wound is on your hand or foot, and you notice that your fingers or toes look pale or bluish. Summary A laceration is a cut that may go through all layers of the skin and into the tissue that is right under the skin. Some lacerations heal on their own. Others need to be closed with stitches (sutures), staples, skin adhesive strips, or skin glue. Proper care of a laceration reduces the risk of infection, helps the laceration heal better, and may prevent scarring. This information is not intended to replace advice given to you by your health care provider. Make sure you discuss any questions you have with your health care provider. Document Revised: 03/24/2020 Document Reviewed: 03/24/2020 Elsevier Patient Education   2023 [employer] Inc[employer]  

## 2022-04-30 NOTE — Progress Notes (Signed)
Subjective:    Patient ID: [name], male    DOB: 1925/01/24, 89 y.o.   MRN: [id number]  Chief Complaint  Patient presents with   Suture / Staple Removal   PT presents to the office today to remove sutures on right pinky. He was seen on 04/19/22 after hitting the skin on an end table. Reports mild erythemas swelling, and tenderness of 8 out 10.  Suture / Staple Removal The sutures were placed 7 to 10 days ago. He tried antibiotic ointment use and regular soap and water washings since the wound repair. The treatment provided mild relief. The maximum temperature noted was less than 100.4 F. There has been no drainage from the wound. There is new redness present. The swelling has not changed. The pain has improved.      Review of Systems  All other systems reviewed and are negative.      Objective:   Physical Exam Vitals reviewed.  Constitutional:      General: He is not in acute distress.    Appearance: He is well-developed.  HENT:     Head: Normocephalic.  Eyes:     General:        Right eye: No discharge.        Left eye: No discharge.     Pupils: Pupils are equal, round, and reactive to light.  Neck:     Thyroid: No thyromegaly.  Cardiovascular:     Rate and Rhythm: Normal rate and regular rhythm.     Heart sounds: Normal heart sounds. No murmur heard. Pulmonary:     Effort: Pulmonary effort is normal. No respiratory distress.     Breath sounds: Normal breath sounds. No wheezing.  Abdominal:     General: Bowel sounds are normal. There is no distension.     Palpations: Abdomen is soft.     Tenderness: There is no abdominal tenderness.  Musculoskeletal:        General: No tenderness. Normal range of motion.     Cervical back: Normal range of motion and neck supple.  Skin:    General: Skin is warm and dry.     Findings: Erythema present. No rash.     Comments: Right pinkly finger swelling with erythemas, wound well approximated   Neurological:     Mental Status:  He is alert and oriented to person, place, and time.     Cranial Nerves: No cranial nerve deficit.     Deep Tendon Reflexes: Reflexes are normal and symmetric.  Psychiatric:        Behavior: Behavior normal.        Thought Content: Thought content normal.        Judgment: Judgment normal.    Removed 3 sutures. Patient removed one suture prior.  BP (!) 166/78   Pulse (!) 52   Temp (!) 97 F (36.1 C) (Temporal)   Ht 6\' 2"  (1.88 m)   Wt 198 lb (89.8 kg)   SpO2 92%   BMI 25.42 kg/m       Assessment & Plan:  [name] comes in today with chief complaint of Suture / Staple Removal   Diagnosis and orders addressed:  1. Visit for suture removal   2. Laceration of right index finger without foreign body without damage to nail, subsequent encounter Keep clean and dry Soak finger Report worsening symptoms in the next 48 hours.  Start Bactrim  - sulfamethoxazole-trimethoprim (BACTRIM DS) 800-160 MG tablet; Take 1 tablet  by mouth 2 (two) times daily.  Dispense: 14 tablet; Refill: 0  Evelina Dun, FNP

## 2022-05-07 ENCOUNTER — Ambulatory Visit (INDEPENDENT_AMBULATORY_CARE_PROVIDER_SITE_OTHER): Payer: Medicare HMO

## 2022-05-07 ENCOUNTER — Encounter: Payer: Self-pay | Admitting: Family

## 2022-05-07 ENCOUNTER — Ambulatory Visit (INDEPENDENT_AMBULATORY_CARE_PROVIDER_SITE_OTHER): Payer: Medicare HMO | Admitting: Family

## 2022-05-07 VITALS — BP 119/70 | HR 66 | Temp 98.0°F | Resp 20 | Ht 74.0 in | Wt 198.0 lb

## 2022-05-07 DIAGNOSIS — L089 Local infection of the skin and subcutaneous tissue, unspecified: Secondary | ICD-10-CM | POA: Diagnosis not present

## 2022-05-07 DIAGNOSIS — L03011 Cellulitis of right finger: Secondary | ICD-10-CM

## 2022-05-07 DIAGNOSIS — R600 Localized edema: Secondary | ICD-10-CM | POA: Diagnosis not present

## 2022-05-07 MED ORDER — DOXYCYCLINE HYCLATE 100 MG PO TABS
100.0000 mg | ORAL_TABLET | Freq: Two times a day (BID) | ORAL | 0 refills | Status: DC
Start: 2022-05-07 — End: 2022-08-27

## 2022-05-07 MED ORDER — CEFTRIAXONE SODIUM 1 G IJ SOLR
1.0000 g | Freq: Once | INTRAMUSCULAR | Status: AC
Start: 2022-05-07 — End: 2022-05-07
  Administered 2022-05-07: 1 g via INTRAMUSCULAR

## 2022-05-07 NOTE — Patient Instructions (Signed)

## 2022-05-07 NOTE — Progress Notes (Signed)
Subjective:    Patient ID: [name], male    DOB: July 04, 1924, 89 y.o.   MRN: [id number]  No chief complaint on file.  Pt presents to the office today with worsening right pinky finger redness and swelling. He had laceration repair on 04/19/22 then started on Bactrim on 04/30/22. Reports his finger was improving, but noticed increased redness, swelling, and tenderness today.  Wound Check Previous treatment included oral antibiotics. His temperature was unmeasured prior to arrival. There has been no drainage from the wound. There is new redness present. There is new swelling present. There is new pain present.      Review of Systems  Constitutional:  Negative for activity change.  All other systems reviewed and are negative.      Objective:   Physical Exam Vitals reviewed.  Constitutional:      General: He is not in acute distress.    Appearance: He is well-developed.  HENT:     Head: Normocephalic.  Eyes:     General:        Right eye: No discharge.        Left eye: No discharge.     Pupils: Pupils are equal, round, and reactive to light.  Neck:     Thyroid: No thyromegaly.  Cardiovascular:     Rate and Rhythm: Normal rate and regular rhythm.     Heart sounds: Normal heart sounds. No murmur heard. Pulmonary:     Effort: Pulmonary effort is normal. No respiratory distress.     Breath sounds: Normal breath sounds. No wheezing.  Abdominal:     General: Bowel sounds are normal. There is no distension.     Palpations: Abdomen is soft.     Tenderness: There is no abdominal tenderness.  Musculoskeletal:        General: No tenderness. Normal range of motion.     Cervical back: Normal range of motion and neck supple.  Skin:    General: Skin is warm and dry.     Findings: No erythema or rash.  Neurological:     Mental Status: He is alert and oriented to person, place, and time.     Cranial Nerves: No cranial nerve deficit.     Deep Tendon Reflexes: Reflexes are normal  and symmetric.  Psychiatric:        Behavior: Behavior normal.        Thought Content: Thought content normal.        Judgment: Judgment normal.       BP 119/70   Pulse 66   Temp 98 F (36.7 C) (Oral)   Resp 20   Ht 6\' 2"  (1.88 m)   Wt 198 lb (89.8 kg)   SpO2 93%   BMI 25.42 kg/m   Assessment & Plan:  [name] comes in today with chief complaint of infected finger   Diagnosis and orders addressed:  1. Infection of finger - cefTRIAXone (ROCEPHIN) injection 1 g - DG Finger Little Right - doxycycline (VIBRA-TABS) 100 MG tablet; Take 1 tablet (100 mg total) by mouth 2 (two) times daily.  Dispense: 20 tablet; Refill: 0  2. Cellulitis of finger of right hand - cefTRIAXone (ROCEPHIN) injection 1 g - DG Finger Little Right - doxycycline (VIBRA-TABS) 100 MG tablet; Take 1 tablet (100 mg total) by mouth 2 (two) times daily.  Dispense: 20 tablet; Refill: 0   Start doxycyline  Keep clean and dry Report any increased redness, swelling, fevers, or tenderness  Follow up in 10 days   Jannifer Rodney, FNP

## 2022-05-17 ENCOUNTER — Encounter: Payer: Self-pay | Admitting: Family

## 2022-05-17 ENCOUNTER — Ambulatory Visit (INDEPENDENT_AMBULATORY_CARE_PROVIDER_SITE_OTHER): Payer: Medicare HMO | Admitting: Family

## 2022-05-17 VITALS — BP 124/58 | HR 72 | Temp 98.7°F | Ht 74.0 in | Wt 198.0 lb

## 2022-05-17 DIAGNOSIS — Z961 Presence of intraocular lens: Secondary | ICD-10-CM | POA: Diagnosis not present

## 2022-05-17 DIAGNOSIS — H16042 Marginal corneal ulcer, left eye: Secondary | ICD-10-CM | POA: Diagnosis not present

## 2022-05-17 DIAGNOSIS — E1169 Type 2 diabetes mellitus with other specified complication: Secondary | ICD-10-CM

## 2022-05-17 DIAGNOSIS — Z5189 Encounter for other specified aftercare: Secondary | ICD-10-CM | POA: Diagnosis not present

## 2022-05-17 DIAGNOSIS — H02135 Senile ectropion of left lower eyelid: Secondary | ICD-10-CM | POA: Diagnosis not present

## 2022-05-17 DIAGNOSIS — I10 Essential (primary) hypertension: Secondary | ICD-10-CM

## 2022-05-17 DIAGNOSIS — H02883 Meibomian gland dysfunction of right eye, unspecified eyelid: Secondary | ICD-10-CM | POA: Diagnosis not present

## 2022-05-17 DIAGNOSIS — H02886 Meibomian gland dysfunction of left eye, unspecified eyelid: Secondary | ICD-10-CM | POA: Diagnosis not present

## 2022-05-17 NOTE — Patient Instructions (Signed)

## 2022-05-17 NOTE — Progress Notes (Signed)
Subjective:    Patient ID: [name], male    DOB: April 09, 1924, 89 y.o.   MRN: [id number]  Chief Complaint  Patient presents with   Follow-up   PT presents to the office today to follow up on right pinky finger. Reports mild pain of 1 out 10. He had laceration repair on 04/19/22 then started on Bactrim on 04/30/22. Then seen on 05/07/22 with increased redness, swelling, and tenderness. He was given Rocephin and doxycyline. He completed this and his finger looks much better! Wound Check He was originally treated more than 14 days ago. The redness has improved. The swelling has improved. The pain has improved.  Hypertension This is a chronic problem. The current episode started more than 1 year ago. The problem has been resolved since onset. The problem is controlled. Pertinent negatives include no malaise/fatigue, peripheral edema or shortness of breath.  Diabetes He presents for his follow-up diabetic visit. He has type 2 diabetes mellitus. His overall blood glucose range is 90-110 mg/dl.      Review of Systems  Constitutional:  Negative for malaise/fatigue.  Respiratory:  Negative for shortness of breath.   All other systems reviewed and are negative.      Objective:   Physical Exam Vitals reviewed.  Constitutional:      General: He is not in acute distress.    Appearance: He is well-developed.  HENT:     Head: Normocephalic.  Eyes:     General:        Right eye: No discharge.        Left eye: No discharge.     Pupils: Pupils are equal, round, and reactive to light.  Neck:     Thyroid: No thyromegaly.  Cardiovascular:     Rate and Rhythm: Normal rate and regular rhythm.     Heart sounds: Normal heart sounds. No murmur heard. Pulmonary:     Effort: Pulmonary effort is normal. No respiratory distress.     Breath sounds: Normal breath sounds. No wheezing.  Abdominal:     General: Bowel sounds are normal. There is no distension.     Palpations: Abdomen is soft.      Tenderness: There is no abdominal tenderness.  Musculoskeletal:        General: No tenderness. Normal range of motion.     Cervical back: Normal range of motion and neck supple.  Skin:    General: Skin is warm and dry.     Findings: No erythema or rash.     Comments: Redness, swelling and tenderness completely resolved. Small scab on pinky  Neurological:     Mental Status: He is alert and oriented to person, place, and time.     Cranial Nerves: No cranial nerve deficit.     Motor: Weakness (generalized, using cane) present.     Deep Tendon Reflexes: Reflexes are normal and symmetric.  Psychiatric:        Behavior: Behavior normal.        Thought Content: Thought content normal.        Judgment: Judgment normal.       BP (!) 124/58   Pulse 72   Temp 98.7 F (37.1 C) (Temporal)   Ht  (1.88 m)   Wt 198 lb (89.8 kg)   SpO2 92%   BMI 25.42 kg/m      Assessment & Plan:  MENELIK MCFARREN comes in today with chief complaint of Follow-up   Diagnosis and orders addressed:  1. Visit for wound check  2. Essential hypertension  3. Type 2 diabetes mellitus with other specified complication, without long-term current use of insulin   Wound greatly improved  DM well controlled at this time Falls precautions  Keep chronic follow up  Jannifer Rodney, FNP

## 2022-07-06 ENCOUNTER — Other Ambulatory Visit: Payer: Self-pay | Admitting: Family

## 2022-07-06 DIAGNOSIS — I10 Essential (primary) hypertension: Secondary | ICD-10-CM

## 2022-07-06 DIAGNOSIS — E1169 Type 2 diabetes mellitus with other specified complication: Secondary | ICD-10-CM

## 2022-07-11 ENCOUNTER — Other Ambulatory Visit: Payer: Self-pay | Admitting: Family

## 2022-07-11 DIAGNOSIS — E039 Hypothyroidism, unspecified: Secondary | ICD-10-CM

## 2022-07-12 ENCOUNTER — Encounter: Payer: Self-pay | Admitting: Family

## 2022-07-12 NOTE — Telephone Encounter (Signed)
LMTCB to Schedule appt  Letter mailed

## 2022-07-12 NOTE — Telephone Encounter (Signed)
Christy NTBS in July for 6 mos FU RF sent to mail order pharmacy

## 2022-07-30 ENCOUNTER — Other Ambulatory Visit: Payer: Self-pay | Admitting: Family

## 2022-07-30 DIAGNOSIS — E1142 Type 2 diabetes mellitus with diabetic polyneuropathy: Secondary | ICD-10-CM

## 2022-08-27 ENCOUNTER — Ambulatory Visit (INDEPENDENT_AMBULATORY_CARE_PROVIDER_SITE_OTHER): Payer: Medicare HMO | Admitting: Family Medicine

## 2022-08-27 ENCOUNTER — Encounter: Payer: Self-pay | Admitting: Family Medicine

## 2022-08-27 VITALS — BP 124/54 | HR 53 | Temp 97.1°F | Ht 74.0 in | Wt 198.4 lb

## 2022-08-27 DIAGNOSIS — S32010S Wedge compression fracture of first lumbar vertebra, sequela: Secondary | ICD-10-CM | POA: Diagnosis not present

## 2022-08-27 DIAGNOSIS — M5416 Radiculopathy, lumbar region: Secondary | ICD-10-CM | POA: Diagnosis not present

## 2022-08-27 NOTE — Progress Notes (Signed)
   Acute Office Visit  Subjective:     Patient ID: [name], male    DOB: [birth year], 89 y.o.   MRN: [id number]  Chief Complaint  Patient presents with   Back Pain    Back Pain This is a chronic problem. The problem has been gradually worsening since onset. The pain is present in the lumbar spine. The quality of the pain is described as aching. The pain radiates to the left thigh. The pain is moderate. The symptoms are aggravated by bending, sitting, standing and twisting. Associated symptoms include leg pain and weakness. Pertinent negatives include no abdominal pain, bladder incontinence, bowel incontinence, chest pain, dysuria, fever, headaches, numbness, paresis, paresthesias, pelvic pain, perianal numbness, tingling or weight loss. He has tried analgesics and bed rest for the symptoms. The treatment provided mild relief.   He had an L1 compression fracture last year. He was seen by Dr. Shon Baton at [facility]. A kyphoplasty was discussed but held off.   Review of Systems  Constitutional:  Negative for fever and weight loss.  Cardiovascular:  Negative for chest pain.  Gastrointestinal:  Negative for abdominal pain and bowel incontinence.  Genitourinary:  Negative for bladder incontinence, dysuria and pelvic pain.  Musculoskeletal:  Positive for back pain.  Neurological:  Positive for weakness. Negative for tingling, numbness, headaches and paresthesias.        Objective:    BP (!) 124/54   Pulse (!) 53   Temp (!) 97.1 F (36.2 C) (Temporal)   Ht 6\' 2"  (1.88 m)   Wt 198 lb 6 oz (90 kg)   SpO2 92%   BMI 25.47 kg/m    Physical Exam Vitals and nursing note reviewed.  Cardiovascular:     Rate and Rhythm: Normal rate and regular rhythm.     Heart sounds: Normal heart sounds. No murmur heard. Pulmonary:     Effort: Pulmonary effort is normal. No respiratory distress.     Breath sounds: Normal breath sounds.  Musculoskeletal:     Lumbar back: Tenderness (left paraspinal)  present. No swelling, edema, signs of trauma or bony tenderness. Positive left straight leg raise test.     Right lower leg: No edema.     Left lower leg: No edema.  Neurological:     Mental Status: He is oriented to person, place, and time. Mental status is at baseline.     Gait: Gait abnormal (antalgic, using cane).  Psychiatric:        Mood and Affect: Mood normal.        Behavior: Behavior normal.     No results found for any visits on 08/27/22.      Assessment & Plan:   [name] was seen today for back pain.  Diagnoses and all orders for this visit:  Closed compression fracture of L1 vertebra, sequela Lumbar radiculopathy Referral back to ortho for further management. No red flags, but worsening symptoms. Tylenol, rest, ice, heat.  -     Ambulatory referral to Orthopedic Surgery   Return if symptoms worsen or fail to improve.  The patient indicates understanding of these issues and agrees with the plan.  Gabriel Earing, FNP

## 2022-09-18 ENCOUNTER — Ambulatory Visit: Payer: Medicare HMO | Admitting: Family

## 2022-09-24 ENCOUNTER — Ambulatory Visit (INDEPENDENT_AMBULATORY_CARE_PROVIDER_SITE_OTHER): Payer: Medicare HMO | Admitting: Family

## 2022-09-24 ENCOUNTER — Telehealth: Payer: Self-pay | Admitting: Family

## 2022-09-24 ENCOUNTER — Encounter: Payer: Self-pay | Admitting: Family

## 2022-09-24 VITALS — BP 152/65 | HR 69 | Temp 97.9°F | Ht 74.0 in | Wt 200.0 lb

## 2022-09-24 DIAGNOSIS — E1169 Type 2 diabetes mellitus with other specified complication: Secondary | ICD-10-CM

## 2022-09-24 DIAGNOSIS — E039 Hypothyroidism, unspecified: Secondary | ICD-10-CM | POA: Diagnosis not present

## 2022-09-24 DIAGNOSIS — M1990 Unspecified osteoarthritis, unspecified site: Secondary | ICD-10-CM | POA: Diagnosis not present

## 2022-09-24 DIAGNOSIS — I700 Atherosclerosis of aorta: Secondary | ICD-10-CM | POA: Insufficient documentation

## 2022-09-24 DIAGNOSIS — J449 Chronic obstructive pulmonary disease, unspecified: Secondary | ICD-10-CM

## 2022-09-24 DIAGNOSIS — Z0000 Encounter for general adult medical examination without abnormal findings: Secondary | ICD-10-CM | POA: Diagnosis not present

## 2022-09-24 DIAGNOSIS — I10 Essential (primary) hypertension: Secondary | ICD-10-CM

## 2022-09-24 DIAGNOSIS — E785 Hyperlipidemia, unspecified: Secondary | ICD-10-CM | POA: Diagnosis not present

## 2022-09-24 DIAGNOSIS — Z0279 Encounter for issue of other medical certificate: Secondary | ICD-10-CM

## 2022-09-24 DIAGNOSIS — Z0001 Encounter for general adult medical examination with abnormal findings: Secondary | ICD-10-CM

## 2022-09-24 DIAGNOSIS — Z23 Encounter for immunization: Secondary | ICD-10-CM

## 2022-09-24 DIAGNOSIS — E663 Overweight: Secondary | ICD-10-CM

## 2022-09-24 LAB — BAYER DCA HB A1C WAIVED: HB A1C (BAYER DCA - WAIVED): 5.4 % (ref 4.8–5.6)

## 2022-09-24 MED ORDER — AMLODIPINE BESYLATE 10 MG PO TABS
10.0000 mg | ORAL_TABLET | Freq: Every day | ORAL | 1 refills | Status: DC
Start: 2022-09-24 — End: 2022-12-14

## 2022-09-24 MED ORDER — BENAZEPRIL HCL 40 MG PO TABS
40.0000 mg | ORAL_TABLET | Freq: Every day | ORAL | 0 refills | Status: DC
Start: 2022-09-24 — End: 2022-12-11

## 2022-09-24 MED ORDER — LEVOTHYROXINE SODIUM 100 MCG PO TABS
ORAL_TABLET | ORAL | 0 refills | Status: DC
Start: 2022-09-24 — End: 2022-12-11

## 2022-09-24 MED ORDER — PRAVASTATIN SODIUM 40 MG PO TABS
40.0000 mg | ORAL_TABLET | Freq: Every day | ORAL | 0 refills | Status: DC
Start: 2022-09-24 — End: 2022-12-11

## 2022-09-24 NOTE — Patient Instructions (Signed)
Health Maintenance After Age 87 After age 87, you are at a higher risk for certain long-term diseases and infections as well as injuries from falls. Falls are a major cause of broken bones and head injuries in people who are older than age 87. Getting regular preventive care can help to keep you healthy and well. Preventive care includes getting regular testing and making lifestyle changes as recommended by your health care provider. Talk with your health care provider about: Which screenings and tests you should have. A screening is a test that checks for a disease when you have no symptoms. A diet and exercise plan that is right for you. What should I know about screenings and tests to prevent falls? Screening and testing are the best ways to find a health problem early. Early diagnosis and treatment give you the best chance of managing medical conditions that are common after age 87. Certain conditions and lifestyle choices may make you more likely to have a fall. Your health care provider may recommend: Regular vision checks. Poor vision and conditions such as cataracts can make you more likely to have a fall. If you wear glasses, make sure to get your prescription updated if your vision changes. Medicine review. Work with your health care provider to regularly review all of the medicines you are taking, including over-the-counter medicines. Ask your health care provider about any side effects that may make you more likely to have a fall. Tell your health care provider if any medicines that you take make you feel dizzy or sleepy. Strength and balance checks. Your health care provider may recommend certain tests to check your strength and balance while standing, walking, or changing positions. Foot health exam. Foot pain and numbness, as well as not wearing proper footwear, can make you more likely to have a fall. Screenings, including: Osteoporosis screening. Osteoporosis is a condition that causes  the bones to get weaker and break more easily. Blood pressure screening. Blood pressure changes and medicines to control blood pressure can make you feel dizzy. Depression screening. You may be more likely to have a fall if you have a fear of falling, feel depressed, or feel unable to do activities that you used to do. Alcohol use screening. Using too much alcohol can affect your balance and may make you more likely to have a fall. Follow these instructions at home: Lifestyle Do not drink alcohol if: Your health care provider tells you not to drink. If you drink alcohol: Limit how much you have to: 0-1 drink a day for women. 0-2 drinks a day for men. Know how much alcohol is in your drink. In the [place] one drink equals one 12 oz bottle of beer (355 mL), one 5 oz glass of wine (148 mL), or one 1 oz glass of hard liquor (44 mL). Do not use any products that contain nicotine or tobacco. These products include cigarettes, chewing tobacco, and vaping devices, such as e-cigarettes. If you need help quitting, ask your health care provider. Activity  Follow a regular exercise program to stay fit. This will help you maintain your balance. Ask your health care provider what types of exercise are appropriate for you. If you need a cane or walker, use it as recommended by your health care provider. Wear supportive shoes that have nonskid soles. Safety  Remove any tripping hazards, such as rugs, cords, and clutter. Install safety equipment such as grab bars in bathrooms and safety rails on stairs. Keep rooms and walkways   well-lit. General instructions Talk with your health care provider about your risks for falling. Tell your health care provider if: You fall. Be sure to tell your health care provider about all falls, even ones that seem minor. You feel dizzy, tiredness (fatigue), or off-balance. Take over-the-counter and prescription medicines only as told by your health care provider. These include  supplements. Eat a healthy diet and maintain a healthy weight. A healthy diet includes low-fat dairy products, low-fat (lean) meats, and fiber from whole grains, beans, and lots of fruits and vegetables. Stay current with your vaccines. Schedule regular health, dental, and eye exams. Summary Having a healthy lifestyle and getting preventive care can help to protect your health and wellness after age 87. Screening and testing are the best way to find a health problem early and help you avoid having a fall. Early diagnosis and treatment give you the best chance for managing medical conditions that are more common for people who are older than age 87. Falls are a major cause of broken bones and head injuries in people who are older than age 87. Take precautions to prevent a fall at home. Work with your health care provider to learn what changes you can make to improve your health and wellness and to prevent falls. This information is not intended to replace advice given to you by your health care provider. Make sure you discuss any questions you have with your health care provider. Document Revised: 06/06/2020 Document Reviewed: 06/06/2020 Elsevier Patient Education  2024 [employer]  

## 2022-09-24 NOTE — Progress Notes (Signed)
Subjective:    Patient ID: [name], male    DOB: May 28, 1924, 89 y.o.   MRN: [id number]  Chief Complaint  Patient presents with   Medical Management of Chronic Issues    Fasting    PT presents to the office today for CPE and chronic follow up. He has COPD and states he has SOB at times. He quit smoking in 1983.    Has aortic atherosclerosis and takes pravastatin daily.   Hypertension This is a chronic problem. The current episode started more than 1 year ago. The problem has been waxing and waning since onset. The problem is uncontrolled. Associated symptoms include blurred vision, malaise/fatigue and shortness of breath. Pertinent negatives include no peripheral edema. Risk factors for coronary artery disease include dyslipidemia, diabetes mellitus, male gender, obesity and sedentary lifestyle. The current treatment provides moderate improvement. Identifiable causes of hypertension include a thyroid problem.  Thyroid Problem Presents for follow-up visit. Symptoms include dry skin, fatigue and hoarse voice. The symptoms have been stable. His past medical history is significant for hyperlipidemia.  Hyperlipidemia This is a chronic problem. The current episode started more than 1 year ago. The problem is uncontrolled. Exacerbating diseases include obesity. Associated symptoms include shortness of breath. Current antihyperlipidemic treatment includes statins. The current treatment provides moderate improvement of lipids. Risk factors for coronary artery disease include dyslipidemia, hypertension, diabetes mellitus, male sex, a sedentary lifestyle and post-menopausal.  Diabetes He presents for his follow-up diabetic visit. He has type 2 diabetes mellitus. Associated symptoms include blurred vision and fatigue. Symptoms are stable. Risk factors for coronary artery disease include diabetes mellitus, dyslipidemia, hypertension, male sex and sedentary lifestyle. He is following a generally healthy  diet. His overall blood glucose range is 90-110 mg/dl.  Arthritis Presents for follow-up visit. He complains of pain and stiffness. Affected locations include the left knee, right knee, left MCP and right MCP (back). His pain is at a severity of 8/10. Associated symptoms include fatigue.      Review of Systems  Constitutional:  Positive for fatigue and malaise/fatigue.  HENT:  Positive for hoarse voice.   Eyes:  Positive for blurred vision.  Respiratory:  Positive for shortness of breath.   Musculoskeletal:  Positive for arthritis and stiffness.  All other systems reviewed and are negative.  Family History  Problem Relation Age of Onset   Stroke Mother    Heart disease Father    Healthy Daughter    Healthy Daughter    Social History   Socioeconomic History   Marital status: Widowed    Spouse name: Not on file   Number of children: 2   Years of education: 6   Highest education level: 6th grade  Occupational History   Occupation: retired    Comment: Designer, fashion/clothing  Tobacco Use   Smoking status: Former   Smokeless tobacco: Never  Advertising account planner   Vaping status: Never Used  Substance and Sexual Activity   Alcohol use: No   Drug use: No   Sexual activity: Not Currently  Other Topics Concern   Not on file  Social History Narrative   Not on file   Social Determinants of Health   Financial Resource Strain: Low Risk  (09/19/2017)   Overall Financial Resource Strain (CARDIA)    Difficulty of Paying Living Expenses: Not hard at all  Food Insecurity: No Food Insecurity (09/19/2017)   Hunger Vital Sign    Worried About Running Out of Food in the Last Year: Never true  Ran Out of Food in the Last Year: Never true  Transportation Needs: No Transportation Needs (09/19/2017)   PRAPARE - Administrator, Civil Service (Medical): No    Lack of Transportation (Non-Medical): No  Physical Activity: Insufficiently Active (09/19/2017)   Exercise Vital Sign    Days of Exercise per  Week: 7 days    Minutes of Exercise per Session: 20 min  Stress: No Stress Concern Present (09/19/2017)   [employer] of Occupational Health - Occupational Stress Questionnaire    Feeling of Stress : Not at all  Social Connections: Moderately Integrated (09/19/2017)   Social Connection and Isolation Panel [NHANES]    Frequency of Communication with Friends and Family: More than three times a week    Frequency of Social Gatherings with Friends and Family: More than three times a week    Attends Religious Services: More than 4 times per year    Active Member of [employer] or Organizations: Yes    Attends Banker Meetings: More than 4 times per year    Marital Status: Widowed        Objective:   Physical Exam Vitals reviewed.  Constitutional:      General: He is not in acute distress.    Appearance: He is well-developed. He is obese.  HENT:     Head: Normocephalic.     Right Ear: Tympanic membrane normal.     Left Ear: Tympanic membrane normal.  Eyes:     General:        Right eye: No discharge.        Left eye: No discharge.     Pupils: Pupils are equal, round, and reactive to light.  Neck:     Thyroid: No thyromegaly.  Cardiovascular:     Rate and Rhythm: Normal rate and regular rhythm.     Heart sounds: Normal heart sounds. No murmur heard. Pulmonary:     Effort: Pulmonary effort is normal. No respiratory distress.     Breath sounds: Normal breath sounds. No wheezing.  Abdominal:     General: Bowel sounds are normal. There is no distension.     Palpations: Abdomen is soft.     Tenderness: There is no abdominal tenderness.  Musculoskeletal:        General: No tenderness.     Cervical back: Normal range of motion and neck supple.     Comments: Pain in lumbar with flexion and extension  Skin:    General: Skin is warm and dry.     Findings: No erythema or rash.  Neurological:     Mental Status: He is alert and oriented to person, place, and time.      Cranial Nerves: No cranial nerve deficit.     Deep Tendon Reflexes: Reflexes are normal and symmetric.  Psychiatric:        Behavior: Behavior normal.        Thought Content: Thought content normal.        Judgment: Judgment normal.       BP (!) 152/65   Pulse 69   Temp 97.9 F (36.6 C)   Ht 6\' 2"  (1.88 m)   Wt 200 lb (90.7 kg)   SpO2 96%   BMI 25.68 kg/m      Assessment & Plan:  [name] comes in today with chief complaint of Medical Management of Chronic Issues (Fasting )   Diagnosis and orders addressed:  1. Essential hypertension - amLODipine (NORVASC) 10  MG tablet; Take 1 tablet (10 mg total) by mouth daily.  Dispense: 90 tablet; Refill: 1 - benazepril (LOTENSIN) 40 MG tablet; Take 1 tablet (40 mg total) by mouth daily.  Dispense: 90 tablet; Refill: 0 - CBC with Differential/Platelet - CMP14+EGFR  2. Hypothyroidism, unspecified type - levothyroxine (SYNTHROID) 100 MCG tablet; TAKE 1 TABLET EVERY DAY BEFORE BREAKFAST  Dispense: 90 tablet; Refill: 0 - CBC with Differential/Platelet - CMP14+EGFR  3. Hyperlipidemia associated with type 2 diabetes mellitus (HCC) - pravastatin (PRAVACHOL) 40 MG tablet; Take 1 tablet (40 mg total) by mouth daily.  Dispense: 90 tablet; Refill: 0 - CBC with Differential/Platelet - CMP14+EGFR - Lipid panel  4. Annual physical exam - Bayer DCA Hb A1c Waived - CBC with Differential/Platelet - CMP14+EGFR - Lipid panel - TSH  5. Arthritis - CBC with Differential/Platelet - CMP14+EGFR  6. Chronic obstructive pulmonary disease, unspecified COPD type (HCC) - CBC with Differential/Platelet - CMP14+EGFR  7. Type 2 diabetes mellitus with other specified complication, without long-term current use of insulin (HCC) - Bayer DCA Hb A1c Waived - CBC with Differential/Platelet - CMP14+EGFR  8. Overweight (BMI 25.0-29.9) - CBC with Differential/Platelet - CMP14+EGFR  9. Aortic atherosclerosis (HCC)   Labs pending Continue  medications  Health Maintenance reviewed Diet and exercise encouraged  Follow up plan: 6 months    Jannifer Rodney, FNP

## 2022-09-24 NOTE — Telephone Encounter (Signed)
Information completed and forwarded to PCP 

## 2022-09-24 NOTE — Telephone Encounter (Signed)
Daughter dropped off and paid for FMLA paperwork to be filled out. Will put in [facility] box.

## 2022-09-25 LAB — CMP14+EGFR
ALT: 7 IU/L (ref 0–44)
AST: 15 IU/L (ref 0–40)
Albumin: 4.2 g/dL (ref 3.6–4.6)
Alkaline Phosphatase: 51 IU/L (ref 44–121)
BUN/Creatinine Ratio: 21 (ref 10–24)
BUN: 24 mg/dL (ref 10–36)
Bilirubin Total: 0.7 mg/dL (ref 0.0–1.2)
CO2: 23 mmol/L (ref 20–29)
Calcium: 9.5 mg/dL (ref 8.6–10.2)
Chloride: 106 mmol/L (ref 96–106)
Creatinine, Ser: 1.13 mg/dL (ref 0.76–1.27)
Globulin, Total: 2.3 g/dL (ref 1.5–4.5)
Glucose: 95 mg/dL (ref 70–99)
Potassium: 3.9 mmol/L (ref 3.5–5.2)
Sodium: 141 mmol/L (ref 134–144)
Total Protein: 6.5 g/dL (ref 6.0–8.5)
eGFR: 60 mL/min/{1.73_m2} (ref >59)

## 2022-09-25 LAB — CBC WITH DIFFERENTIAL/PLATELET
Basophils Absolute: 0 10*3/uL (ref 0.0–0.2)
Basos: 0 %
EOS (ABSOLUTE): 0.1 10*3/uL (ref 0.0–0.4)
Eos: 2 %
Hematocrit: 41.9 % (ref 37.5–51.0)
Hemoglobin: 14.4 g/dL (ref 13.0–17.7)
Immature Grans (Abs): 0 10*3/uL (ref 0.0–0.1)
Immature Granulocytes: 0 %
Lymphocytes Absolute: 2 10*3/uL (ref 0.7–3.1)
Lymphs: 38 %
MCH: 32.7 pg (ref 26.6–33.0)
MCHC: 34.4 g/dL (ref 31.5–35.7)
MCV: 95 fL (ref 79–97)
Monocytes Absolute: 0.5 10*3/uL (ref 0.1–0.9)
Monocytes: 9 %
Neutrophils Absolute: 2.7 10*3/uL (ref 1.4–7.0)
Neutrophils: 51 %
Platelets: 129 10*3/uL — ABNORMAL LOW (ref 150–450)
RBC: 4.41 x10E6/uL (ref 4.14–5.80)
RDW: 12.2 % (ref 11.6–15.4)
WBC: 5.2 10*3/uL (ref 3.4–10.8)

## 2022-09-25 LAB — LIPID PANEL
Chol/HDL Ratio: 2.6 ratio (ref 0.0–5.0)
Cholesterol, Total: 133 mg/dL (ref 100–199)
HDL: 51 mg/dL (ref >39)
LDL Chol Calc (NIH): 68 mg/dL (ref 0–99)
Triglycerides: 69 mg/dL (ref 0–149)
VLDL Cholesterol Cal: 14 mg/dL (ref 5–40)

## 2022-09-25 LAB — TSH: TSH: 1 u[IU]/mL (ref 0.450–4.500)

## 2022-09-26 NOTE — Telephone Encounter (Signed)
PCP completed and signed FMLA forms. They have been faxed to [employer] at fax number [phone]. LMOVM and informed they are complete.

## 2022-10-05 DIAGNOSIS — M545 Low back pain, unspecified: Secondary | ICD-10-CM | POA: Diagnosis not present

## 2022-10-19 DIAGNOSIS — M5416 Radiculopathy, lumbar region: Secondary | ICD-10-CM | POA: Diagnosis not present

## 2022-10-19 DIAGNOSIS — M48061 Spinal stenosis, lumbar region without neurogenic claudication: Secondary | ICD-10-CM | POA: Diagnosis not present

## 2022-11-04 DIAGNOSIS — M5451 Vertebrogenic low back pain: Secondary | ICD-10-CM | POA: Diagnosis not present

## 2022-11-16 DIAGNOSIS — M533 Sacrococcygeal disorders, not elsewhere classified: Secondary | ICD-10-CM | POA: Diagnosis not present

## 2022-11-16 DIAGNOSIS — M5416 Radiculopathy, lumbar region: Secondary | ICD-10-CM | POA: Diagnosis not present

## 2022-11-16 DIAGNOSIS — M48061 Spinal stenosis, lumbar region without neurogenic claudication: Secondary | ICD-10-CM | POA: Diagnosis not present

## 2022-11-26 ENCOUNTER — Other Ambulatory Visit: Payer: Self-pay | Admitting: Family

## 2022-11-26 DIAGNOSIS — R9389 Abnormal findings on diagnostic imaging of other specified body structures: Secondary | ICD-10-CM

## 2022-11-26 NOTE — Progress Notes (Addendum)
Please let patient know I have ordered an [place] to rule out aneurysm.   Jannifer Rodney, FNP

## 2022-11-26 NOTE — Progress Notes (Signed)
Left detailed message it was ordered. Okay per [facility]

## 2022-12-06 ENCOUNTER — Telehealth: Payer: Self-pay | Admitting: *Deleted

## 2022-12-06 DIAGNOSIS — M533 Sacrococcygeal disorders, not elsewhere classified: Secondary | ICD-10-CM | POA: Diagnosis not present

## 2022-12-06 NOTE — Telephone Encounter (Signed)
Copied from CRM [id number]. Topic: Clinical - Lab/Test Results >> Dec 06, 2022  8:40 AM Maxwell Marion wrote: Reason for CRM: Patient's daughter [name] called because she received message about [place] being ordered for patient but she said that's all she was told and is unsure on next steps. Wants call back.

## 2022-12-06 NOTE — Telephone Encounter (Signed)
Patient is scheduled on 02/12/2022 at 1:30 PM at [facility]. I LM for Patient concerning this information.

## 2022-12-06 NOTE — Telephone Encounter (Signed)
Called and spoke with daughter she wants to see when patient is getting scheduled for U/S

## 2022-12-11 ENCOUNTER — Other Ambulatory Visit: Payer: Self-pay | Admitting: Family

## 2022-12-11 DIAGNOSIS — E039 Hypothyroidism, unspecified: Secondary | ICD-10-CM

## 2022-12-11 DIAGNOSIS — I10 Essential (primary) hypertension: Secondary | ICD-10-CM

## 2022-12-11 DIAGNOSIS — E1169 Type 2 diabetes mellitus with other specified complication: Secondary | ICD-10-CM

## 2022-12-13 DIAGNOSIS — H02135 Senile ectropion of left lower eyelid: Secondary | ICD-10-CM | POA: Diagnosis not present

## 2022-12-13 DIAGNOSIS — Z961 Presence of intraocular lens: Secondary | ICD-10-CM | POA: Diagnosis not present

## 2022-12-13 DIAGNOSIS — H16042 Marginal corneal ulcer, left eye: Secondary | ICD-10-CM | POA: Diagnosis not present

## 2022-12-13 DIAGNOSIS — H02883 Meibomian gland dysfunction of right eye, unspecified eyelid: Secondary | ICD-10-CM | POA: Diagnosis not present

## 2022-12-13 DIAGNOSIS — H02886 Meibomian gland dysfunction of left eye, unspecified eyelid: Secondary | ICD-10-CM | POA: Diagnosis not present

## 2022-12-14 ENCOUNTER — Ambulatory Visit (INDEPENDENT_AMBULATORY_CARE_PROVIDER_SITE_OTHER): Payer: Medicare HMO | Admitting: Family

## 2022-12-14 ENCOUNTER — Other Ambulatory Visit: Payer: Self-pay | Admitting: Family

## 2022-12-14 ENCOUNTER — Encounter: Payer: Self-pay | Admitting: Family

## 2022-12-14 ENCOUNTER — Ambulatory Visit (HOSPITAL_COMMUNITY)
Admission: RE | Admit: 2022-12-14 | Discharge: 2022-12-14 | Disposition: A | Discharge status: Home / Self Care | Payer: Medicare HMO | Source: Ambulatory Visit | Attending: Family | Admitting: Family

## 2022-12-14 VITALS — BP 132/77 | HR 62 | Temp 97.2°F | Ht 74.0 in | Wt 198.0 lb

## 2022-12-14 DIAGNOSIS — J449 Chronic obstructive pulmonary disease, unspecified: Secondary | ICD-10-CM

## 2022-12-14 DIAGNOSIS — I700 Atherosclerosis of aorta: Secondary | ICD-10-CM

## 2022-12-14 DIAGNOSIS — Z23 Encounter for immunization: Secondary | ICD-10-CM | POA: Diagnosis not present

## 2022-12-14 DIAGNOSIS — R9389 Abnormal findings on diagnostic imaging of other specified body structures: Secondary | ICD-10-CM

## 2022-12-14 DIAGNOSIS — I10 Essential (primary) hypertension: Secondary | ICD-10-CM

## 2022-12-14 DIAGNOSIS — E039 Hypothyroidism, unspecified: Secondary | ICD-10-CM

## 2022-12-14 DIAGNOSIS — I714 Abdominal aortic aneurysm, without rupture, unspecified: Secondary | ICD-10-CM | POA: Diagnosis not present

## 2022-12-14 DIAGNOSIS — E1169 Type 2 diabetes mellitus with other specified complication: Secondary | ICD-10-CM

## 2022-12-14 DIAGNOSIS — E663 Overweight: Secondary | ICD-10-CM | POA: Diagnosis not present

## 2022-12-14 DIAGNOSIS — E785 Hyperlipidemia, unspecified: Secondary | ICD-10-CM | POA: Diagnosis not present

## 2022-12-14 DIAGNOSIS — M1990 Unspecified osteoarthritis, unspecified site: Secondary | ICD-10-CM | POA: Diagnosis not present

## 2022-12-14 LAB — CMP14+EGFR
ALT: 8 [IU]/L (ref 0–44)
AST: 11 [IU]/L (ref 0–40)
Albumin: 4.4 g/dL (ref 3.6–4.6)
Alkaline Phosphatase: 56 [IU]/L (ref 44–121)
BUN/Creatinine Ratio: 23 (ref 10–24)
BUN: 27 mg/dL (ref 10–36)
Bilirubin Total: 0.9 mg/dL (ref 0.0–1.2)
CO2: 23 mmol/L (ref 20–29)
Calcium: 9.5 mg/dL (ref 8.6–10.2)
Chloride: 104 mmol/L (ref 96–106)
Creatinine, Ser: 1.16 mg/dL (ref 0.76–1.27)
Globulin, Total: 2.4 g/dL (ref 1.5–4.5)
Glucose: 108 mg/dL — ABNORMAL HIGH (ref 70–99)
Potassium: 4.1 mmol/L (ref 3.5–5.2)
Sodium: 142 mmol/L (ref 134–144)
Total Protein: 6.8 g/dL (ref 6.0–8.5)
eGFR: 58 mL/min/{1.73_m2} — ABNORMAL LOW (ref >59)

## 2022-12-14 LAB — BAYER DCA HB A1C WAIVED: HB A1C (BAYER DCA - WAIVED): 5.5 % (ref 4.8–5.6)

## 2022-12-14 MED ORDER — BENAZEPRIL HCL 40 MG PO TABS: 40.0000 mg | ORAL_TABLET | Freq: Every day | ORAL | 0 refills | Status: DC

## 2022-12-14 MED ORDER — AMLODIPINE BESYLATE 10 MG PO TABS: 10.0000 mg | ORAL_TABLET | Freq: Every day | ORAL | 1 refills | Status: DC

## 2022-12-14 MED ORDER — LEVOTHYROXINE SODIUM 100 MCG PO TABS: ORAL_TABLET | ORAL | 2 refills | Status: DC

## 2022-12-14 MED ORDER — PRAVASTATIN SODIUM 40 MG PO TABS: 40.0000 mg | ORAL_TABLET | Freq: Every day | ORAL | 0 refills | Status: DC

## 2022-12-14 NOTE — Progress Notes (Signed)
Subjective:    Patient ID: [name], male    DOB: [birth year], 89 y.o.   MRN: [id number]  Chief Complaint  Patient presents with   Medical Management of Chronic Issues    Patient is going to pain med had injection last week and has helped a lot.   PT presents to the office today for chronic follow up. He has COPD and states he has SOB at times. He quit smoking in 1983.     Has aortic atherosclerosis and takes pravastatin daily.   He is followed by Ortho as needed for back pain and radiculopathy pain.  He got a steroid injection in his lumbar that has greatly helped.  Hypertension This is a chronic problem. The current episode started more than 1 year ago. The problem has been waxing and waning since onset. The problem is uncontrolled. Associated symptoms include malaise/fatigue. Pertinent negatives include no blurred vision, peripheral edema or shortness of breath. Risk factors for coronary artery disease include dyslipidemia and obesity. The current treatment provides moderate improvement. Identifiable causes of hypertension include a thyroid problem.  Thyroid Problem Presents for follow-up visit. Symptoms include fatigue and hoarse voice. Patient reports no anxiety, constipation or diarrhea. The symptoms have been stable. His past medical history is significant for hyperlipidemia.  Hyperlipidemia This is a chronic problem. The current episode started more than 1 year ago. Exacerbating diseases include obesity. Pertinent negatives include no shortness of breath. Current antihyperlipidemic treatment includes statins. The current treatment provides moderate improvement of lipids. Risk factors for coronary artery disease include diabetes mellitus, dyslipidemia, hypertension and a sedentary lifestyle.  Diabetes He presents for his follow-up diabetic visit. He has type 2 diabetes mellitus. Pertinent negatives for hypoglycemia include no nervousness/anxiousness. Associated symptoms include  fatigue. Pertinent negatives for diabetes include no blurred vision. Risk factors for coronary artery disease include dyslipidemia, diabetes mellitus, hypertension and sedentary lifestyle. His overall blood glucose range is 90-110 mg/dl.  Arthritis Presents for follow-up visit. He complains of pain and stiffness. Affected locations include the left knee, right knee, left MCP and right MCP. His pain is at a severity of 7/10. Associated symptoms include fatigue. Pertinent negatives include no diarrhea.      Review of Systems  Constitutional:  Positive for fatigue and malaise/fatigue.  HENT:  Positive for hoarse voice.   Eyes:  Negative for blurred vision.  Respiratory:  Negative for shortness of breath.   Gastrointestinal:  Negative for constipation and diarrhea.  Musculoskeletal:  Positive for arthritis and stiffness.  Psychiatric/Behavioral:  The patient is not nervous/anxious.   All other systems reviewed and are negative.      Objective:   Physical Exam Vitals reviewed.  Constitutional:      General: He is not in acute distress.    Appearance: He is well-developed. He is obese.  HENT:     Head: Normocephalic.     Right Ear: Tympanic membrane normal.     Left Ear: Tympanic membrane normal.     Ears:     Comments: HOH Eyes:     General:        Right eye: No discharge.        Left eye: No discharge.     Pupils: Pupils are equal, round, and reactive to light.  Neck:     Thyroid: No thyromegaly.  Cardiovascular:     Rate and Rhythm: Normal rate and regular rhythm.     Heart sounds: Normal heart sounds. No murmur heard. Pulmonary:  Effort: Pulmonary effort is normal. No respiratory distress.     Breath sounds: Normal breath sounds. No wheezing.  Abdominal:     General: Bowel sounds are normal. There is no distension.     Palpations: Abdomen is soft.     Tenderness: There is no abdominal tenderness.  Musculoskeletal:        General: Tenderness present. Normal range of  motion.     Cervical back: Normal range of motion and neck supple.     Comments: Pain in lumbar with flexion and extension  Skin:    General: Skin is warm and dry.     Findings: No erythema or rash.  Neurological:     Mental Status: He is alert and oriented to person, place, and time.     Cranial Nerves: No cranial nerve deficit.     Deep Tendon Reflexes: Reflexes are normal and symmetric.  Psychiatric:        Behavior: Behavior normal.        Thought Content: Thought content normal.        Judgment: Judgment normal.      BP (!) 183/78   Pulse 62   Temp (!) 97.2 F (36.2 C) (Temporal)   Ht 6\' 2"  (1.88 m)   Wt 198 lb (89.8 kg)   SpO2 95%   BMI 25.42 kg/m       Assessment & Plan:  [name] comes in today with chief complaint of Medical Management of Chronic Issues (Patient is going to pain med had injection last week and has helped a lot.)   Diagnosis and orders addressed:  1. Essential hypertension - amLODipine (NORVASC) 10 MG tablet; Take 1 tablet (10 mg total) by mouth daily.  Dispense: 90 tablet; Refill: 1 - benazepril (LOTENSIN) 40 MG tablet; Take 1 tablet (40 mg total) by mouth daily.  Dispense: 90 tablet; Refill: 0 - CMP14+EGFR  2. Hypothyroidism, unspecified type - levothyroxine (SYNTHROID) 100 MCG tablet; TAKE 1 TABLET EVERY DAY BEFORE BREAKFAST  Dispense: 90 tablet; Refill: 2 - CMP14+EGFR  3. Hyperlipidemia associated with type 2 diabetes mellitus (HCC) - pravastatin (PRAVACHOL) 40 MG tablet; Take 1 tablet (40 mg total) by mouth daily.  Dispense: 90 tablet; Refill: 0 - CMP14+EGFR  4. Encounter for immunization - Flu Vaccine Trivalent High Dose (Fluad) - CMP14+EGFR  5. Type 2 diabetes mellitus with other specified complication, without long-term current use of insulin (HCC) - Bayer DCA Hb A1c Waived - CMP14+EGFR  6. Chronic obstructive pulmonary disease, unspecified COPD type (HCC) - CMP14+EGFR  7. Arthritis - CMP14+EGFR  8. Overweight (BMI  25.0-29.9) - CMP14+EGFR  9. Aortic atherosclerosis (HCC) - CMP14+EGFR   Labs pending Health Maintenance reviewed Diet and exercise encouraged  Follow up plan: 4 months    Jannifer Rodney, FNP

## 2022-12-14 NOTE — Patient Instructions (Signed)

## 2023-01-31 DIAGNOSIS — H10231 Serous conjunctivitis, except viral, right eye: Secondary | ICD-10-CM | POA: Diagnosis not present

## 2023-03-07 ENCOUNTER — Ambulatory Visit: Payer: Self-pay | Admitting: Family

## 2023-03-07 NOTE — Telephone Encounter (Signed)
 Copied from CRM [id number]. Topic: Clinical - Red Word Triage >> Mar 07, 2023  3:31 PM Carlatta H wrote: [facility] that prompted transfer to Nurse Triage: Patients daughter called in due to patient having blood in his urine//    Chief Complaint: Blood in urine Frequency: Intermittent for 2 days Pertinent Negatives: Patient denies pain or burning with urination, frequency Disposition: [] ED /[x] Urgent Care (no appt availability in office) / [] Appointment(In office/virtual)/ []  [facility]/ [] Home Care/ [] Refused Recommended Disposition /[] [place] [facility]/ []  Follow-up with PCP Additional Notes: Patient's daughter stated that patient has been having blood in the urine since yesterday. It is like a dark red tint that comes and goes. Patient does not have burning with urination or frequency. No availability in the office for appt. Patient's daughter will bring patient to urgent care.    Reason for Disposition  Blood in urine  (Exception: Could be normal menstrual bleeding.)  Answer Assessment - Initial Assessment Questions 1. COLOR of URINE: Describe the color of the urine.  (e.g., tea-colored, pink, red, bloody) Do you have blood clots in your urine? (e.g., none, pea, grape, small coin)     Red tint,   2. ONSET: When did the bleeding start?      Yesterday   3. EPISODES: How many times has there been blood in the urine? or How many times today?     Once today  4. PAIN with URINATION: Is there any pain with passing your urine? If Yes, ask: How bad is the pain?  (Scale 1-10; or mild, moderate, severe)    - MILD: Complains slightly about urination hurting.    - MODERATE: Interferes with normal activities.      - SEVERE: Excruciating, unwilling or unable to urinate because of the pain.      No  5. FEVER: Do you have a fever? If Yes, ask: What is your temperature, how was it measured, and when did it start?     No 6. ASSOCIATED SYMPTOMS: Are you passing  urine more frequently than usual?     No  7. OTHER SYMPTOMS: Do you have any other symptoms? (e.g., back/flank pain, abdomen pain, vomiting)     No, pt already has prior hx of back pain  Protocols used: Urine - Blood In-A-AH

## 2023-03-08 DIAGNOSIS — R82998 Other abnormal findings in urine: Secondary | ICD-10-CM | POA: Diagnosis not present

## 2023-04-29 ENCOUNTER — Telehealth (INDEPENDENT_AMBULATORY_CARE_PROVIDER_SITE_OTHER): Admitting: Family

## 2023-04-29 ENCOUNTER — Encounter: Payer: Self-pay | Admitting: Family

## 2023-04-29 DIAGNOSIS — B9689 Other specified bacterial agents as the cause of diseases classified elsewhere: Secondary | ICD-10-CM

## 2023-04-29 DIAGNOSIS — J208 Acute bronchitis due to other specified organisms: Secondary | ICD-10-CM

## 2023-04-29 MED ORDER — AZITHROMYCIN 250 MG PO TABS: ORAL_TABLET | ORAL | 0 refills | Status: DC

## 2023-04-29 NOTE — Progress Notes (Signed)
 Virtual Visit Consent   [name], you are scheduled for a virtual visit with a [facility] provider today. Just as with appointments in the office, your consent must be obtained to participate. Your consent will be active for this visit and any virtual visit you may have with one of our providers in the next 365 days. If you have a MyChart account, a copy of this consent can be sent to you electronically.  As this is a virtual visit, video technology does not allow for your provider to perform a traditional examination. This may limit your provider's ability to fully assess your condition. If your provider identifies any concerns that need to be evaluated in person or the need to arrange testing (such as labs, EKG, etc.), we will make arrangements to do so. Although advances in technology are sophisticated, we cannot ensure that it will always work on either your end or our end. If the connection with a video visit is poor, the visit may have to be switched to a telephone visit. With either a video or telephone visit, we are not always able to ensure that we have a secure connection.  By engaging in this virtual visit, you consent to the provision of healthcare and authorize for your insurance to be billed (if applicable) for the services provided during this visit. Depending on your insurance coverage, you may receive a charge related to this service.  I need to obtain your verbal consent now. Are you willing to proceed with your visit today? CHANZE TEAGLE has provided verbal consent on 04/29/2023 for a virtual visit (video or telephone). Jannifer Rodney, FNP  Date: 04/29/2023 12:55 PM   Virtual Visit via Video Note   I, Jannifer Rodney, connected with  [name]  ([id number], Jan 06, 1925) on 04/29/23 at  1:55 PM EDT by a video-enabled telemedicine application and verified that I am speaking with the correct person using two identifiers.  Location: Patient: Virtual Visit Location Patient: Home Provider:  Virtual Visit Location Provider: Home Office   I discussed the limitations of evaluation and management by telemedicine and the availability of in person appointments. The patient expressed understanding and agreed to proceed.    History of Present Illness: [name] is a 89 y.o. who identifies as a male who was assigned male at birth, and is being seen today for URI symptoms that started 4 days ago and worsening.  HPI: URI  This is a new problem. The current episode started in the past 7 days. The problem has been gradually worsening. There has been no fever. Associated symptoms include congestion, coughing, rhinorrhea and sinus pain. Pertinent negatives include no ear pain, headaches, joint pain, sneezing or sore throat. He has tried acetaminophen for the symptoms. The treatment provided mild relief.    Problems:  Patient Active Problem List   Diagnosis Date Noted   Aortic atherosclerosis (HCC) 09/24/2022   Hyperlipidemia associated with type 2 diabetes mellitus (HCC) 03/06/2018   Overweight (BMI 25.0-29.9) 03/06/2018   Actinic keratoses 05/02/2016   Hypothyroidism 09/30/2015   Gout    Diabetes (HCC) 04/15/2008   Essential hypertension 04/15/2008   COPD (chronic obstructive pulmonary disease) (HCC) 04/15/2008   Arthritis 04/15/2008   CHEST PAIN 04/15/2008   ABDOMINAL AORTIC ANEURYSM REPAIR, HX OF 04/15/2008    Allergies:  Allergies  Allergen Reactions   Penicillins    Medications:  Current Outpatient Medications:    azithromycin (ZITHROMAX) 250 MG tablet, Take 500 mg once, then 250  mg for four days, Disp: 6 tablet, Rfl: 0   amLODipine (NORVASC) 10 MG tablet, Take 1 tablet (10 mg total) by mouth daily., Disp: 90 tablet, Rfl: 1   benazepril (LOTENSIN) 40 MG tablet, Take 1 tablet (40 mg total) by mouth daily., Disp: 90 tablet, Rfl: 0   glucose blood (ACCU-CHEK AVIVA PLUS) test strip, TEST BLOOD GLUCOSE TWICE DAILY Dx E11.9, Disp: 200 strip, Rfl: 3   levothyroxine (SYNTHROID) 100  MCG tablet, TAKE 1 TABLET EVERY DAY BEFORE BREAKFAST, Disp: 90 tablet, Rfl: 2   pravastatin (PRAVACHOL) 40 MG tablet, Take 1 tablet (40 mg total) by mouth daily., Disp: 90 tablet, Rfl: 0   traMADol (ULTRAM) 50 MG tablet, TAKE 1 TABLET BY MOUTH ONCE DAILY AS NEEDED FOR 15 DAYS, Disp: , Rfl:   Observations/Objective: Patient is well-developed, well-nourished in no acute distress.  Resting comfortably  at home.  Head is normocephalic, atraumatic.  No labored breathing.  Speech is clear and coherent with logical content.  Patient is alert and oriented at baseline.  Dry nonproductive cough  Assessment and Plan: 1. Acute bacterial bronchitis (Primary) - azithromycin (ZITHROMAX) 250 MG tablet; Take 500 mg once, then 250 mg for four days  Dispense: 6 tablet; Refill: 0  - Take meds as prescribed - Use a cool mist humidifier  -Use saline nose sprays frequently -Force fluids -For any cough or congestion  Use plain Mucinex- regular strength or max strength is fine -For fever or aces or pains- take tylenol or ibuprofen. -Throat lozenges if help -Follow up if symptoms worsen or do not improve   Follow Up Instructions: I discussed the assessment and treatment plan with the patient. The patient was provided an opportunity to ask questions and all were answered. The patient agreed with the plan and demonstrated an understanding of the instructions.  A copy of instructions were sent to the patient via MyChart unless otherwise noted below.     The patient was advised to call back or seek an in-person evaluation if the symptoms worsen or if the condition fails to improve as anticipated.    Jannifer Rodney, FNP

## 2023-05-06 ENCOUNTER — Other Ambulatory Visit: Payer: Self-pay | Admitting: Family

## 2023-05-06 DIAGNOSIS — E1169 Type 2 diabetes mellitus with other specified complication: Secondary | ICD-10-CM

## 2023-05-06 DIAGNOSIS — I10 Essential (primary) hypertension: Secondary | ICD-10-CM

## 2023-05-06 NOTE — Telephone Encounter (Signed)
 [name] in May for 6 mos FU RF sent to pharmacy

## 2023-05-06 NOTE — Telephone Encounter (Signed)
 Will call back to schedule an apt

## 2023-05-07 ENCOUNTER — Other Ambulatory Visit: Payer: Self-pay | Admitting: Family

## 2023-05-07 DIAGNOSIS — E039 Hypothyroidism, unspecified: Secondary | ICD-10-CM

## 2023-05-07 DIAGNOSIS — I10 Essential (primary) hypertension: Secondary | ICD-10-CM

## 2023-05-07 DIAGNOSIS — E1169 Type 2 diabetes mellitus with other specified complication: Secondary | ICD-10-CM

## 2023-05-07 MED ORDER — LEVOTHYROXINE SODIUM 100 MCG PO TABS: ORAL_TABLET | ORAL | 0 refills | Status: DC

## 2023-05-07 MED ORDER — AMLODIPINE BESYLATE 10 MG PO TABS: 10.0000 mg | ORAL_TABLET | Freq: Every day | ORAL | 0 refills | Status: DC

## 2023-05-07 NOTE — Telephone Encounter (Signed)
 Copied from CRM [id number]. Topic: Clinical - Medication Refill >> May 07, 2023 12:30 PM Abigail D wrote: Most Recent Primary Care Visit:   Medication:  levothyroxine (SYNTHROID) 100 MCG tablet benazepril (LOTENSIN) 40 MG tablet amLODipine (NORVASC) 10 MG tablet pravastatin (PRAVACHOL) 40 MG tablet   Has the patient contacted their pharmacy? Yes (Agent: If no, request that the patient contact the pharmacy for the refill. If patient does not wish to contact the pharmacy document the reason why and proceed with request.) Pt has to be seen before med can be refilled, saw pt has been seen last on 09/24/22 (Agent: If yes, when and what did the pharmacy advise?)  Is this the correct pharmacy for this prescription? Yes If no, delete pharmacy and type the correct one.  This is the patient's preferred pharmacy:  [facility] - [place], [place] - [address] [place] [address] Phone: [phone] Fax: [phone]   Has the prescription been filled recently? No  Is the patient out of the medication? No  Has the patient been seen for an appointment in the last year OR does the patient have an upcoming appointment? Yes  Can we respond through MyChart? No  Agent: Please be advised that Rx refills may take up to 3 business days. We ask that you follow-up with your pharmacy.

## 2023-05-07 NOTE — Telephone Encounter (Signed)
 Last Fill: Levothyroxine: 12/14/22     Benazepril: 05/06/23     Amlodipine: 12/14/22     Pravastatin: 05/06/23  Last OV: 04/29/23 Next OV: None Scheduled  Routing to provider for review/authorization.

## 2023-05-07 NOTE — Telephone Encounter (Signed)
 Aware refills sent to pharmacy, 6 mos FU appt sched for 07/02/23

## 2023-05-23 DIAGNOSIS — H1820 Unspecified corneal edema: Secondary | ICD-10-CM | POA: Diagnosis not present

## 2023-05-30 DIAGNOSIS — H1820 Unspecified corneal edema: Secondary | ICD-10-CM | POA: Diagnosis not present

## 2023-05-30 DIAGNOSIS — H02102 Unspecified ectropion of right lower eyelid: Secondary | ICD-10-CM | POA: Diagnosis not present

## 2023-06-07 DIAGNOSIS — H53001 Unspecified amblyopia, right eye: Secondary | ICD-10-CM | POA: Diagnosis not present

## 2023-06-07 DIAGNOSIS — H16001 Unspecified corneal ulcer, right eye: Secondary | ICD-10-CM | POA: Diagnosis not present

## 2023-06-07 DIAGNOSIS — H1820 Unspecified corneal edema: Secondary | ICD-10-CM | POA: Diagnosis not present

## 2023-06-17 DIAGNOSIS — H53001 Unspecified amblyopia, right eye: Secondary | ICD-10-CM | POA: Diagnosis not present

## 2023-06-17 DIAGNOSIS — H16001 Unspecified corneal ulcer, right eye: Secondary | ICD-10-CM | POA: Diagnosis not present

## 2023-06-17 DIAGNOSIS — H1820 Unspecified corneal edema: Secondary | ICD-10-CM | POA: Diagnosis not present

## 2023-06-20 DIAGNOSIS — S8992XA Unspecified injury of left lower leg, initial encounter: Secondary | ICD-10-CM | POA: Diagnosis not present

## 2023-06-20 DIAGNOSIS — R001 Bradycardia, unspecified: Secondary | ICD-10-CM | POA: Diagnosis not present

## 2023-06-20 DIAGNOSIS — S72002A Fracture of unspecified part of neck of left femur, initial encounter for closed fracture: Secondary | ICD-10-CM | POA: Diagnosis not present

## 2023-06-20 DIAGNOSIS — E1151 Type 2 diabetes mellitus with diabetic peripheral angiopathy without gangrene: Secondary | ICD-10-CM | POA: Diagnosis not present

## 2023-06-20 DIAGNOSIS — Z87891 Personal history of nicotine dependence: Secondary | ICD-10-CM | POA: Diagnosis not present

## 2023-06-20 DIAGNOSIS — E785 Hyperlipidemia, unspecified: Secondary | ICD-10-CM | POA: Diagnosis not present

## 2023-06-20 DIAGNOSIS — R29898 Other symptoms and signs involving the musculoskeletal system: Secondary | ICD-10-CM | POA: Diagnosis not present

## 2023-06-20 DIAGNOSIS — R2689 Other abnormalities of gait and mobility: Secondary | ICD-10-CM | POA: Diagnosis not present

## 2023-06-20 DIAGNOSIS — R278 Other lack of coordination: Secondary | ICD-10-CM | POA: Diagnosis not present

## 2023-06-20 DIAGNOSIS — W1830XA Fall on same level, unspecified, initial encounter: Secondary | ICD-10-CM | POA: Diagnosis not present

## 2023-06-20 DIAGNOSIS — J9601 Acute respiratory failure with hypoxia: Secondary | ICD-10-CM | POA: Diagnosis not present

## 2023-06-20 DIAGNOSIS — M25552 Pain in left hip: Secondary | ICD-10-CM | POA: Diagnosis not present

## 2023-06-20 DIAGNOSIS — E039 Hypothyroidism, unspecified: Secondary | ICD-10-CM | POA: Diagnosis not present

## 2023-06-20 DIAGNOSIS — R748 Abnormal levels of other serum enzymes: Secondary | ICD-10-CM | POA: Diagnosis not present

## 2023-06-20 DIAGNOSIS — I10 Essential (primary) hypertension: Secondary | ICD-10-CM | POA: Diagnosis not present

## 2023-06-20 DIAGNOSIS — R1312 Dysphagia, oropharyngeal phase: Secondary | ICD-10-CM | POA: Diagnosis not present

## 2023-06-20 DIAGNOSIS — M6281 Muscle weakness (generalized): Secondary | ICD-10-CM | POA: Diagnosis not present

## 2023-06-20 DIAGNOSIS — E1142 Type 2 diabetes mellitus with diabetic polyneuropathy: Secondary | ICD-10-CM | POA: Diagnosis not present

## 2023-06-20 DIAGNOSIS — R1314 Dysphagia, pharyngoesophageal phase: Secondary | ICD-10-CM | POA: Diagnosis not present

## 2023-06-20 DIAGNOSIS — S0990XA Unspecified injury of head, initial encounter: Secondary | ICD-10-CM | POA: Diagnosis not present

## 2023-06-20 DIAGNOSIS — S72112A Displaced fracture of greater trochanter of left femur, initial encounter for closed fracture: Secondary | ICD-10-CM | POA: Diagnosis not present

## 2023-06-20 DIAGNOSIS — Z7401 Bed confinement status: Secondary | ICD-10-CM | POA: Diagnosis not present

## 2023-06-20 DIAGNOSIS — E119 Type 2 diabetes mellitus without complications: Secondary | ICD-10-CM | POA: Diagnosis not present

## 2023-06-20 DIAGNOSIS — I714 Abdominal aortic aneurysm, without rupture, unspecified: Secondary | ICD-10-CM | POA: Diagnosis not present

## 2023-06-20 DIAGNOSIS — S72142A Displaced intertrochanteric fracture of left femur, initial encounter for closed fracture: Secondary | ICD-10-CM | POA: Diagnosis not present

## 2023-06-20 DIAGNOSIS — E1165 Type 2 diabetes mellitus with hyperglycemia: Secondary | ICD-10-CM | POA: Diagnosis not present

## 2023-06-20 DIAGNOSIS — M47812 Spondylosis without myelopathy or radiculopathy, cervical region: Secondary | ICD-10-CM | POA: Diagnosis not present

## 2023-06-20 DIAGNOSIS — E872 Acidosis, unspecified: Secondary | ICD-10-CM | POA: Diagnosis not present

## 2023-06-20 DIAGNOSIS — S72142D Displaced intertrochanteric fracture of left femur, subsequent encounter for closed fracture with routine healing: Secondary | ICD-10-CM | POA: Diagnosis not present

## 2023-06-20 DIAGNOSIS — J986 Disorders of diaphragm: Secondary | ICD-10-CM | POA: Diagnosis not present

## 2023-06-20 DIAGNOSIS — I4510 Unspecified right bundle-branch block: Secondary | ICD-10-CM | POA: Diagnosis not present

## 2023-06-20 DIAGNOSIS — J9811 Atelectasis: Secondary | ICD-10-CM | POA: Diagnosis not present

## 2023-06-20 DIAGNOSIS — S72144A Nondisplaced intertrochanteric fracture of right femur, initial encounter for closed fracture: Secondary | ICD-10-CM | POA: Diagnosis not present

## 2023-06-20 DIAGNOSIS — D62 Acute posthemorrhagic anemia: Secondary | ICD-10-CM | POA: Diagnosis not present

## 2023-06-20 DIAGNOSIS — T796XXA Traumatic ischemia of muscle, initial encounter: Secondary | ICD-10-CM | POA: Diagnosis not present

## 2023-06-25 DIAGNOSIS — H9190 Unspecified hearing loss, unspecified ear: Secondary | ICD-10-CM | POA: Diagnosis not present

## 2023-06-25 DIAGNOSIS — I10 Essential (primary) hypertension: Secondary | ICD-10-CM | POA: Diagnosis not present

## 2023-06-25 DIAGNOSIS — R1312 Dysphagia, oropharyngeal phase: Secondary | ICD-10-CM | POA: Diagnosis not present

## 2023-06-25 DIAGNOSIS — E1165 Type 2 diabetes mellitus with hyperglycemia: Secondary | ICD-10-CM | POA: Diagnosis not present

## 2023-06-25 DIAGNOSIS — R29898 Other symptoms and signs involving the musculoskeletal system: Secondary | ICD-10-CM | POA: Diagnosis not present

## 2023-06-25 DIAGNOSIS — R278 Other lack of coordination: Secondary | ICD-10-CM | POA: Diagnosis not present

## 2023-06-25 DIAGNOSIS — R1314 Dysphagia, pharyngoesophageal phase: Secondary | ICD-10-CM | POA: Diagnosis not present

## 2023-06-25 DIAGNOSIS — E782 Mixed hyperlipidemia: Secondary | ICD-10-CM | POA: Diagnosis not present

## 2023-06-25 DIAGNOSIS — R2689 Other abnormalities of gait and mobility: Secondary | ICD-10-CM | POA: Diagnosis not present

## 2023-06-25 DIAGNOSIS — Z7401 Bed confinement status: Secondary | ICD-10-CM | POA: Diagnosis not present

## 2023-06-25 DIAGNOSIS — E039 Hypothyroidism, unspecified: Secondary | ICD-10-CM | POA: Diagnosis not present

## 2023-06-25 DIAGNOSIS — S72142D Displaced intertrochanteric fracture of left femur, subsequent encounter for closed fracture with routine healing: Secondary | ICD-10-CM | POA: Diagnosis not present

## 2023-06-25 DIAGNOSIS — S7292XA Unspecified fracture of left femur, initial encounter for closed fracture: Secondary | ICD-10-CM | POA: Diagnosis not present

## 2023-06-25 DIAGNOSIS — D649 Anemia, unspecified: Secondary | ICD-10-CM | POA: Diagnosis not present

## 2023-06-25 DIAGNOSIS — R001 Bradycardia, unspecified: Secondary | ICD-10-CM | POA: Diagnosis not present

## 2023-06-25 DIAGNOSIS — E559 Vitamin D deficiency, unspecified: Secondary | ICD-10-CM | POA: Diagnosis not present

## 2023-06-25 DIAGNOSIS — I714 Abdominal aortic aneurysm, without rupture, unspecified: Secondary | ICD-10-CM | POA: Diagnosis not present

## 2023-06-25 DIAGNOSIS — M6281 Muscle weakness (generalized): Secondary | ICD-10-CM | POA: Diagnosis not present

## 2023-06-28 DIAGNOSIS — H9190 Unspecified hearing loss, unspecified ear: Secondary | ICD-10-CM | POA: Diagnosis not present

## 2023-06-28 DIAGNOSIS — I10 Essential (primary) hypertension: Secondary | ICD-10-CM | POA: Diagnosis not present

## 2023-06-28 DIAGNOSIS — D649 Anemia, unspecified: Secondary | ICD-10-CM | POA: Diagnosis not present

## 2023-06-28 DIAGNOSIS — S7292XA Unspecified fracture of left femur, initial encounter for closed fracture: Secondary | ICD-10-CM | POA: Diagnosis not present

## 2023-06-28 DIAGNOSIS — E782 Mixed hyperlipidemia: Secondary | ICD-10-CM | POA: Diagnosis not present

## 2023-06-28 DIAGNOSIS — E559 Vitamin D deficiency, unspecified: Secondary | ICD-10-CM | POA: Diagnosis not present

## 2023-06-28 DIAGNOSIS — E039 Hypothyroidism, unspecified: Secondary | ICD-10-CM | POA: Diagnosis not present

## 2023-06-30 DIAGNOSIS — M6281 Muscle weakness (generalized): Secondary | ICD-10-CM | POA: Diagnosis not present

## 2023-06-30 DIAGNOSIS — R051 Acute cough: Secondary | ICD-10-CM | POA: Diagnosis not present

## 2023-06-30 DIAGNOSIS — S7292XD Unspecified fracture of left femur, subsequent encounter for closed fracture with routine healing: Secondary | ICD-10-CM | POA: Diagnosis not present

## 2023-06-30 DIAGNOSIS — J986 Disorders of diaphragm: Secondary | ICD-10-CM | POA: Diagnosis not present

## 2023-06-30 DIAGNOSIS — I714 Abdominal aortic aneurysm, without rupture, unspecified: Secondary | ICD-10-CM | POA: Diagnosis not present

## 2023-06-30 DIAGNOSIS — I517 Cardiomegaly: Secondary | ICD-10-CM | POA: Diagnosis not present

## 2023-06-30 DIAGNOSIS — I82612 Acute embolism and thrombosis of superficial veins of left upper extremity: Secondary | ICD-10-CM | POA: Diagnosis not present

## 2023-06-30 DIAGNOSIS — R1314 Dysphagia, pharyngoesophageal phase: Secondary | ICD-10-CM | POA: Diagnosis not present

## 2023-06-30 DIAGNOSIS — Z96643 Presence of artificial hip joint, bilateral: Secondary | ICD-10-CM | POA: Diagnosis not present

## 2023-06-30 DIAGNOSIS — D509 Iron deficiency anemia, unspecified: Secondary | ICD-10-CM | POA: Diagnosis not present

## 2023-06-30 DIAGNOSIS — Z9981 Dependence on supplemental oxygen: Secondary | ICD-10-CM | POA: Diagnosis not present

## 2023-06-30 DIAGNOSIS — H543 Unqualified visual loss, both eyes: Secondary | ICD-10-CM | POA: Diagnosis not present

## 2023-06-30 DIAGNOSIS — R278 Other lack of coordination: Secondary | ICD-10-CM | POA: Diagnosis not present

## 2023-06-30 DIAGNOSIS — R918 Other nonspecific abnormal finding of lung field: Secondary | ICD-10-CM | POA: Diagnosis not present

## 2023-06-30 DIAGNOSIS — Z7982 Long term (current) use of aspirin: Secondary | ICD-10-CM | POA: Diagnosis not present

## 2023-06-30 DIAGNOSIS — S72142A Displaced intertrochanteric fracture of left femur, initial encounter for closed fracture: Secondary | ICD-10-CM | POA: Diagnosis not present

## 2023-06-30 DIAGNOSIS — R110 Nausea: Secondary | ICD-10-CM | POA: Diagnosis not present

## 2023-06-30 DIAGNOSIS — R4182 Altered mental status, unspecified: Secondary | ICD-10-CM | POA: Diagnosis not present

## 2023-06-30 DIAGNOSIS — I96 Gangrene, not elsewhere classified: Secondary | ICD-10-CM | POA: Diagnosis not present

## 2023-06-30 DIAGNOSIS — R1312 Dysphagia, oropharyngeal phase: Secondary | ICD-10-CM | POA: Diagnosis not present

## 2023-06-30 DIAGNOSIS — J449 Chronic obstructive pulmonary disease, unspecified: Secondary | ICD-10-CM | POA: Diagnosis not present

## 2023-06-30 DIAGNOSIS — M79601 Pain in right arm: Secondary | ICD-10-CM | POA: Diagnosis not present

## 2023-06-30 DIAGNOSIS — G9341 Metabolic encephalopathy: Secondary | ICD-10-CM | POA: Diagnosis not present

## 2023-06-30 DIAGNOSIS — E119 Type 2 diabetes mellitus without complications: Secondary | ICD-10-CM | POA: Diagnosis not present

## 2023-06-30 DIAGNOSIS — E871 Hypo-osmolality and hyponatremia: Secondary | ICD-10-CM | POA: Diagnosis not present

## 2023-06-30 DIAGNOSIS — I10 Essential (primary) hypertension: Secondary | ICD-10-CM | POA: Diagnosis not present

## 2023-06-30 DIAGNOSIS — E039 Hypothyroidism, unspecified: Secondary | ICD-10-CM | POA: Diagnosis not present

## 2023-06-30 DIAGNOSIS — E222 Syndrome of inappropriate secretion of antidiuretic hormone: Secondary | ICD-10-CM | POA: Diagnosis not present

## 2023-06-30 DIAGNOSIS — J90 Pleural effusion, not elsewhere classified: Secondary | ICD-10-CM | POA: Diagnosis not present

## 2023-06-30 DIAGNOSIS — Z09 Encounter for follow-up examination after completed treatment for conditions other than malignant neoplasm: Secondary | ICD-10-CM | POA: Diagnosis not present

## 2023-06-30 DIAGNOSIS — S72142D Displaced intertrochanteric fracture of left femur, subsequent encounter for closed fracture with routine healing: Secondary | ICD-10-CM | POA: Diagnosis not present

## 2023-06-30 DIAGNOSIS — L03113 Cellulitis of right upper limb: Secondary | ICD-10-CM | POA: Diagnosis not present

## 2023-06-30 DIAGNOSIS — H9190 Unspecified hearing loss, unspecified ear: Secondary | ICD-10-CM | POA: Diagnosis not present

## 2023-06-30 DIAGNOSIS — Z1629 Resistance to other single specified antibiotic: Secondary | ICD-10-CM | POA: Diagnosis not present

## 2023-06-30 DIAGNOSIS — I959 Hypotension, unspecified: Secondary | ICD-10-CM | POA: Diagnosis not present

## 2023-06-30 DIAGNOSIS — M25552 Pain in left hip: Secondary | ICD-10-CM | POA: Diagnosis not present

## 2023-06-30 DIAGNOSIS — E1165 Type 2 diabetes mellitus with hyperglycemia: Secondary | ICD-10-CM | POA: Diagnosis not present

## 2023-06-30 DIAGNOSIS — S51811A Laceration without foreign body of right forearm, initial encounter: Secondary | ICD-10-CM | POA: Diagnosis not present

## 2023-06-30 DIAGNOSIS — R2689 Other abnormalities of gait and mobility: Secondary | ICD-10-CM | POA: Diagnosis not present

## 2023-06-30 DIAGNOSIS — Z66 Do not resuscitate: Secondary | ICD-10-CM | POA: Diagnosis not present

## 2023-06-30 DIAGNOSIS — S7292XA Unspecified fracture of left femur, initial encounter for closed fracture: Secondary | ICD-10-CM | POA: Diagnosis not present

## 2023-06-30 DIAGNOSIS — E782 Mixed hyperlipidemia: Secondary | ICD-10-CM | POA: Diagnosis not present

## 2023-06-30 DIAGNOSIS — N390 Urinary tract infection, site not specified: Secondary | ICD-10-CM | POA: Diagnosis not present

## 2023-07-01 DIAGNOSIS — E782 Mixed hyperlipidemia: Secondary | ICD-10-CM | POA: Diagnosis not present

## 2023-07-01 DIAGNOSIS — I10 Essential (primary) hypertension: Secondary | ICD-10-CM | POA: Diagnosis not present

## 2023-07-01 DIAGNOSIS — D509 Iron deficiency anemia, unspecified: Secondary | ICD-10-CM | POA: Diagnosis not present

## 2023-07-01 DIAGNOSIS — E039 Hypothyroidism, unspecified: Secondary | ICD-10-CM | POA: Diagnosis not present

## 2023-07-01 DIAGNOSIS — S7292XD Unspecified fracture of left femur, subsequent encounter for closed fracture with routine healing: Secondary | ICD-10-CM | POA: Diagnosis not present

## 2023-07-02 ENCOUNTER — Ambulatory Visit: Admitting: Family

## 2023-07-02 DIAGNOSIS — I10 Essential (primary) hypertension: Secondary | ICD-10-CM | POA: Diagnosis not present

## 2023-07-02 DIAGNOSIS — Z96643 Presence of artificial hip joint, bilateral: Secondary | ICD-10-CM | POA: Diagnosis not present

## 2023-07-02 DIAGNOSIS — I517 Cardiomegaly: Secondary | ICD-10-CM | POA: Diagnosis not present

## 2023-07-02 DIAGNOSIS — E039 Hypothyroidism, unspecified: Secondary | ICD-10-CM | POA: Diagnosis not present

## 2023-07-02 DIAGNOSIS — R051 Acute cough: Secondary | ICD-10-CM | POA: Diagnosis not present

## 2023-07-02 DIAGNOSIS — H9190 Unspecified hearing loss, unspecified ear: Secondary | ICD-10-CM | POA: Diagnosis not present

## 2023-07-02 DIAGNOSIS — H543 Unqualified visual loss, both eyes: Secondary | ICD-10-CM | POA: Diagnosis not present

## 2023-07-02 DIAGNOSIS — J986 Disorders of diaphragm: Secondary | ICD-10-CM | POA: Diagnosis not present

## 2023-07-02 DIAGNOSIS — E782 Mixed hyperlipidemia: Secondary | ICD-10-CM | POA: Diagnosis not present

## 2023-07-02 DIAGNOSIS — L03113 Cellulitis of right upper limb: Secondary | ICD-10-CM | POA: Diagnosis not present

## 2023-07-04 DIAGNOSIS — M25552 Pain in left hip: Secondary | ICD-10-CM | POA: Diagnosis not present

## 2023-07-05 DIAGNOSIS — M79601 Pain in right arm: Secondary | ICD-10-CM | POA: Diagnosis not present

## 2023-07-05 DIAGNOSIS — I10 Essential (primary) hypertension: Secondary | ICD-10-CM | POA: Diagnosis not present

## 2023-07-05 DIAGNOSIS — Z09 Encounter for follow-up examination after completed treatment for conditions other than malignant neoplasm: Secondary | ICD-10-CM | POA: Diagnosis not present

## 2023-07-05 DIAGNOSIS — E119 Type 2 diabetes mellitus without complications: Secondary | ICD-10-CM | POA: Diagnosis not present

## 2023-07-05 DIAGNOSIS — J449 Chronic obstructive pulmonary disease, unspecified: Secondary | ICD-10-CM | POA: Diagnosis not present

## 2023-07-07 DIAGNOSIS — R051 Acute cough: Secondary | ICD-10-CM | POA: Diagnosis not present

## 2023-07-09 DIAGNOSIS — L03113 Cellulitis of right upper limb: Secondary | ICD-10-CM | POA: Diagnosis not present

## 2023-07-09 DIAGNOSIS — E039 Hypothyroidism, unspecified: Secondary | ICD-10-CM | POA: Diagnosis not present

## 2023-07-09 DIAGNOSIS — S7292XA Unspecified fracture of left femur, initial encounter for closed fracture: Secondary | ICD-10-CM | POA: Diagnosis not present

## 2023-07-09 DIAGNOSIS — S51811A Laceration without foreign body of right forearm, initial encounter: Secondary | ICD-10-CM | POA: Diagnosis not present

## 2023-07-09 DIAGNOSIS — Z09 Encounter for follow-up examination after completed treatment for conditions other than malignant neoplasm: Secondary | ICD-10-CM | POA: Diagnosis not present

## 2023-07-09 DIAGNOSIS — I10 Essential (primary) hypertension: Secondary | ICD-10-CM | POA: Diagnosis not present

## 2023-07-09 DIAGNOSIS — E119 Type 2 diabetes mellitus without complications: Secondary | ICD-10-CM | POA: Diagnosis not present

## 2023-07-09 DIAGNOSIS — Z9981 Dependence on supplemental oxygen: Secondary | ICD-10-CM | POA: Diagnosis not present

## 2023-07-12 DIAGNOSIS — Z9981 Dependence on supplemental oxygen: Secondary | ICD-10-CM | POA: Diagnosis not present

## 2023-07-12 DIAGNOSIS — E119 Type 2 diabetes mellitus without complications: Secondary | ICD-10-CM | POA: Diagnosis not present

## 2023-07-12 DIAGNOSIS — I10 Essential (primary) hypertension: Secondary | ICD-10-CM | POA: Diagnosis not present

## 2023-07-12 DIAGNOSIS — Z09 Encounter for follow-up examination after completed treatment for conditions other than malignant neoplasm: Secondary | ICD-10-CM | POA: Diagnosis not present

## 2023-07-12 DIAGNOSIS — S7292XD Unspecified fracture of left femur, subsequent encounter for closed fracture with routine healing: Secondary | ICD-10-CM | POA: Diagnosis not present

## 2023-07-12 DIAGNOSIS — E039 Hypothyroidism, unspecified: Secondary | ICD-10-CM | POA: Diagnosis not present

## 2023-07-12 DIAGNOSIS — L03113 Cellulitis of right upper limb: Secondary | ICD-10-CM | POA: Diagnosis not present

## 2023-07-15 DIAGNOSIS — R918 Other nonspecific abnormal finding of lung field: Secondary | ICD-10-CM | POA: Diagnosis not present

## 2023-07-15 DIAGNOSIS — R519 Headache, unspecified: Secondary | ICD-10-CM | POA: Diagnosis not present

## 2023-07-15 DIAGNOSIS — E222 Syndrome of inappropriate secretion of antidiuretic hormone: Secondary | ICD-10-CM | POA: Diagnosis not present

## 2023-07-15 DIAGNOSIS — R4182 Altered mental status, unspecified: Secondary | ICD-10-CM | POA: Diagnosis not present

## 2023-07-15 DIAGNOSIS — I96 Gangrene, not elsewhere classified: Secondary | ICD-10-CM | POA: Diagnosis not present

## 2023-07-15 DIAGNOSIS — E119 Type 2 diabetes mellitus without complications: Secondary | ICD-10-CM | POA: Diagnosis not present

## 2023-07-15 DIAGNOSIS — S72142A Displaced intertrochanteric fracture of left femur, initial encounter for closed fracture: Secondary | ICD-10-CM | POA: Diagnosis not present

## 2023-07-15 DIAGNOSIS — R410 Disorientation, unspecified: Secondary | ICD-10-CM | POA: Diagnosis not present

## 2023-07-15 DIAGNOSIS — I959 Hypotension, unspecified: Secondary | ICD-10-CM | POA: Diagnosis not present

## 2023-07-15 DIAGNOSIS — S91302A Unspecified open wound, left foot, initial encounter: Secondary | ICD-10-CM | POA: Diagnosis not present

## 2023-07-15 DIAGNOSIS — G9340 Encephalopathy, unspecified: Secondary | ICD-10-CM | POA: Diagnosis not present

## 2023-07-15 DIAGNOSIS — L89611 Pressure ulcer of right heel, stage 1: Secondary | ICD-10-CM | POA: Diagnosis not present

## 2023-07-15 DIAGNOSIS — R110 Nausea: Secondary | ICD-10-CM | POA: Diagnosis not present

## 2023-07-15 DIAGNOSIS — I82612 Acute embolism and thrombosis of superficial veins of left upper extremity: Secondary | ICD-10-CM | POA: Diagnosis not present

## 2023-07-15 DIAGNOSIS — N390 Urinary tract infection, site not specified: Secondary | ICD-10-CM | POA: Diagnosis not present

## 2023-07-15 DIAGNOSIS — S91301A Unspecified open wound, right foot, initial encounter: Secondary | ICD-10-CM | POA: Diagnosis not present

## 2023-07-15 DIAGNOSIS — L89610 Pressure ulcer of right heel, unstageable: Secondary | ICD-10-CM | POA: Diagnosis not present

## 2023-07-15 DIAGNOSIS — I70234 Atherosclerosis of native arteries of right leg with ulceration of heel and midfoot: Secondary | ICD-10-CM | POA: Diagnosis not present

## 2023-07-15 DIAGNOSIS — R531 Weakness: Secondary | ICD-10-CM | POA: Diagnosis not present

## 2023-07-15 DIAGNOSIS — Z0189 Encounter for other specified special examinations: Secondary | ICD-10-CM | POA: Diagnosis not present

## 2023-07-15 DIAGNOSIS — Z1629 Resistance to other single specified antibiotic: Secondary | ICD-10-CM | POA: Diagnosis not present

## 2023-07-15 DIAGNOSIS — E785 Hyperlipidemia, unspecified: Secondary | ICD-10-CM | POA: Diagnosis not present

## 2023-07-15 DIAGNOSIS — R0902 Hypoxemia: Secondary | ICD-10-CM | POA: Diagnosis not present

## 2023-07-15 DIAGNOSIS — J984 Other disorders of lung: Secondary | ICD-10-CM | POA: Diagnosis not present

## 2023-07-15 DIAGNOSIS — I10 Essential (primary) hypertension: Secondary | ICD-10-CM | POA: Diagnosis not present

## 2023-07-15 DIAGNOSIS — I70244 Atherosclerosis of native arteries of left leg with ulceration of heel and midfoot: Secondary | ICD-10-CM | POA: Diagnosis not present

## 2023-07-15 DIAGNOSIS — N3000 Acute cystitis without hematuria: Secondary | ICD-10-CM | POA: Diagnosis not present

## 2023-07-15 DIAGNOSIS — S91001A Unspecified open wound, right ankle, initial encounter: Secondary | ICD-10-CM | POA: Diagnosis not present

## 2023-07-15 DIAGNOSIS — Z66 Do not resuscitate: Secondary | ICD-10-CM | POA: Diagnosis not present

## 2023-07-15 DIAGNOSIS — J439 Emphysema, unspecified: Secondary | ICD-10-CM | POA: Diagnosis not present

## 2023-07-15 DIAGNOSIS — Z7401 Bed confinement status: Secondary | ICD-10-CM | POA: Diagnosis not present

## 2023-07-15 DIAGNOSIS — J90 Pleural effusion, not elsewhere classified: Secondary | ICD-10-CM | POA: Diagnosis not present

## 2023-07-15 DIAGNOSIS — E871 Hypo-osmolality and hyponatremia: Secondary | ICD-10-CM | POA: Diagnosis not present

## 2023-07-15 DIAGNOSIS — E039 Hypothyroidism, unspecified: Secondary | ICD-10-CM | POA: Diagnosis not present

## 2023-07-15 DIAGNOSIS — G9341 Metabolic encephalopathy: Secondary | ICD-10-CM | POA: Diagnosis not present

## 2023-07-15 DIAGNOSIS — Z7982 Long term (current) use of aspirin: Secondary | ICD-10-CM | POA: Diagnosis not present

## 2023-07-22 ENCOUNTER — Inpatient Hospital Stay: Admitting: Nurse Practitioner

## 2023-07-24 ENCOUNTER — Inpatient Hospital Stay: Admitting: Family Medicine

## 2023-07-29 ENCOUNTER — Telehealth: Payer: Self-pay | Admitting: *Deleted

## 2023-07-29 NOTE — Transitions of Care (Post Inpatient/ED Visit) (Signed)
   07/29/2023  Name: NOWELL SITES MRN: [id number] DOB: [birth year]  Today's TOC FU Call Status: Today's TOC FU Call Status:: Successful TOC FU Call Completed TOC FU Call Complete Date: 07/29/23 Patient's Name and Date of Birth confirmed.  Transition Care Management Follow-up Telephone Call Date of Discharge: 07/26/23 Discharge Facility: Other ([facility]) Name of Other ([facility]) Discharge Facility: [facility] Type of Discharge: Inpatient Admission Primary Inpatient Discharge Diagnosis:: Hyponatremia  Items Reviewed: Telephone call to [facility]) to verify services were started at home for patient, Spoke with LaPortia who confirms patient was started on services 07/26/23.    Medications Reviewed Today: Medications Reviewed Today   Medications were not reviewed in this encounter           Mliss Creed [facility], BSN RN Care Manager/ Transition of Care [place]/ [facility] [phone]

## 2023-10-30 DEATH — deceased
